# Patient Record
Sex: Female | Born: 1985 | Race: Black or African American | Hispanic: No | Marital: Single | Smoking: Smoker, current status unknown
Health system: Southern US, Community
[De-identification: ages and names within clinical notes are randomized; demographics above are authoritative.]

## PROBLEM LIST (undated history)

## (undated) DIAGNOSIS — F17200 Nicotine dependence, unspecified, uncomplicated: Secondary | ICD-10-CM

## (undated) DIAGNOSIS — J45909 Unspecified asthma, uncomplicated: Secondary | ICD-10-CM

## (undated) DIAGNOSIS — J40 Bronchitis, not specified as acute or chronic: Secondary | ICD-10-CM

---
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
## 2013-04-26 DIAGNOSIS — O1490 Unspecified pre-eclampsia, unspecified trimester: Secondary | ICD-10-CM

## 2019-08-08 DIAGNOSIS — O1490 Unspecified pre-eclampsia, unspecified trimester: Secondary | ICD-10-CM

## 2019-08-08 HISTORY — DX: Unspecified pre-eclampsia, unspecified trimester: O14.90

## 2020-04-26 ENCOUNTER — Inpatient Hospital Stay (HOSPITAL_COMMUNITY): Payer: Medicaid Other | Admitting: Anesthesiology

## 2020-04-26 ENCOUNTER — Inpatient Hospital Stay (HOSPITAL_COMMUNITY)
Admission: AD | Admit: 2020-04-26 | Discharge: 2020-04-29 | DRG: 787 | Disposition: A | Discharge status: Home / Self Care | Payer: Medicaid Other | Attending: Obstetrics and Gynecology | Admitting: Obstetrics and Gynecology

## 2020-04-26 ENCOUNTER — Encounter (HOSPITAL_COMMUNITY): Payer: Self-pay | Admitting: Obstetrics and Gynecology

## 2020-04-26 ENCOUNTER — Other Ambulatory Visit: Payer: Self-pay

## 2020-04-26 ENCOUNTER — Encounter (HOSPITAL_COMMUNITY)
Admission: AD | Disposition: A | Discharge status: Home / Self Care | Payer: Self-pay | Source: Home / Self Care | Attending: Obstetrics and Gynecology

## 2020-04-26 DIAGNOSIS — O34211 Maternal care for low transverse scar from previous cesarean delivery: Secondary | ICD-10-CM | POA: Diagnosis present

## 2020-04-26 DIAGNOSIS — R079 Chest pain, unspecified: Secondary | ICD-10-CM

## 2020-04-26 DIAGNOSIS — O99284 Endocrine, nutritional and metabolic diseases complicating childbirth: Secondary | ICD-10-CM | POA: Diagnosis present

## 2020-04-26 DIAGNOSIS — Z20822 Contact with and (suspected) exposure to covid-19: Secondary | ICD-10-CM | POA: Diagnosis present

## 2020-04-26 DIAGNOSIS — O4593 Premature separation of placenta, unspecified, third trimester: Secondary | ICD-10-CM | POA: Diagnosis present

## 2020-04-26 DIAGNOSIS — Z98891 History of uterine scar from previous surgery: Secondary | ICD-10-CM

## 2020-04-26 DIAGNOSIS — O114 Pre-existing hypertension with pre-eclampsia, complicating childbirth: Secondary | ICD-10-CM | POA: Diagnosis present

## 2020-04-26 DIAGNOSIS — O328XX0 Maternal care for other malpresentation of fetus, not applicable or unspecified: Secondary | ICD-10-CM | POA: Diagnosis present

## 2020-04-26 DIAGNOSIS — O368130 Decreased fetal movements, third trimester, not applicable or unspecified: Secondary | ICD-10-CM | POA: Diagnosis present

## 2020-04-26 DIAGNOSIS — O1002 Pre-existing essential hypertension complicating childbirth: Secondary | ICD-10-CM | POA: Diagnosis present

## 2020-04-26 DIAGNOSIS — Z3A34 34 weeks gestation of pregnancy: Secondary | ICD-10-CM

## 2020-04-26 DIAGNOSIS — O368131 Decreased fetal movements, third trimester, fetus 1: Secondary | ICD-10-CM | POA: Diagnosis not present

## 2020-04-26 DIAGNOSIS — O119 Pre-existing hypertension with pre-eclampsia, unspecified trimester: Secondary | ICD-10-CM | POA: Diagnosis present

## 2020-04-26 DIAGNOSIS — O9081 Anemia of the puerperium: Secondary | ICD-10-CM | POA: Diagnosis not present

## 2020-04-26 DIAGNOSIS — E876 Hypokalemia: Secondary | ICD-10-CM | POA: Diagnosis present

## 2020-04-26 DIAGNOSIS — O4590 Premature separation of placenta, unspecified, unspecified trimester: Secondary | ICD-10-CM | POA: Diagnosis present

## 2020-04-26 DIAGNOSIS — D62 Acute posthemorrhagic anemia: Secondary | ICD-10-CM | POA: Diagnosis not present

## 2020-04-26 DIAGNOSIS — O4693 Antepartum hemorrhage, unspecified, third trimester: Secondary | ICD-10-CM | POA: Diagnosis present

## 2020-04-26 DIAGNOSIS — I10 Essential (primary) hypertension: Secondary | ICD-10-CM | POA: Diagnosis present

## 2020-04-26 DIAGNOSIS — O458X3 Other premature separation of placenta, third trimester: Secondary | ICD-10-CM | POA: Diagnosis not present

## 2020-04-26 HISTORY — DX: Nicotine dependence, unspecified, uncomplicated: F17.200

## 2020-04-26 HISTORY — DX: Unspecified asthma, uncomplicated: J45.909

## 2020-04-26 HISTORY — DX: Bronchitis, not specified as acute or chronic: J40

## 2020-04-26 LAB — CBC
HCT: 34 % — ABNORMAL LOW (ref 36.0–46.0)
HCT: 22.4 % — ABNORMAL LOW (ref 36.0–46.0)
Hemoglobin: 11.4 g/dL — ABNORMAL LOW (ref 12.0–15.0)
Hemoglobin: 7.7 g/dL — ABNORMAL LOW (ref 12.0–15.0)
MCH: 30.2 pg (ref 26.0–34.0)
MCH: 30.7 pg (ref 26.0–34.0)
MCHC: 33.5 g/dL (ref 30.0–36.0)
MCHC: 34.4 g/dL (ref 30.0–36.0)
MCV: 89.9 fL (ref 80.0–100.0)
MCV: 89.2 fL (ref 80.0–100.0)
Platelets: 203 10*3/uL (ref 150–400)
Platelets: 182 10*3/uL (ref 150–400)
RBC: 3.78 MIL/uL — ABNORMAL LOW (ref 3.87–5.11)
RBC: 2.51 MIL/uL — ABNORMAL LOW (ref 3.87–5.11)
RDW: 12.5 % (ref 11.5–15.5)
RDW: 12.6 % (ref 11.5–15.5)
WBC: 8.5 10*3/uL (ref 4.0–10.5)
WBC: 17.9 10*3/uL — ABNORMAL HIGH (ref 4.0–10.5)
nRBC: 0 % (ref 0.0–0.2)
nRBC: 0 % (ref 0.0–0.2)

## 2020-04-26 LAB — COMPREHENSIVE METABOLIC PANEL
ALT: 20 U/L (ref 0–44)
AST: 24 U/L (ref 15–41)
Albumin: 2.5 g/dL — ABNORMAL LOW (ref 3.5–5.0)
Alkaline Phosphatase: 91 U/L (ref 38–126)
Anion gap: 10 (ref 5–15)
BUN: <5 mg/dL — ABNORMAL LOW (ref 6–20)
CO2: 20 mmol/L — ABNORMAL LOW (ref 22–32)
Calcium: 8 mg/dL — ABNORMAL LOW (ref 8.9–10.3)
Chloride: 107 mmol/L (ref 98–111)
Creatinine, Ser: 0.73 mg/dL (ref 0.44–1.00)
GFR calc Af Amer: >60 mL/min (ref >60)
GFR calc non Af Amer: >60 mL/min (ref >60)
Glucose, Bld: 99 mg/dL (ref 70–99)
Potassium: 3 mmol/L — ABNORMAL LOW (ref 3.5–5.1)
Sodium: 137 mmol/L (ref 135–145)
Total Bilirubin: 0.5 mg/dL (ref 0.3–1.2)
Total Protein: 5.6 g/dL — ABNORMAL LOW (ref 6.5–8.1)

## 2020-04-26 LAB — ABO/RH: ABO/RH(D): A POS

## 2020-04-26 LAB — RAPID HIV SCREEN (HIV 1/2 AB+AG)
HIV 1/2 Antibodies: NONREACTIVE
HIV-1 P24 Antigen - HIV24: NONREACTIVE

## 2020-04-26 LAB — PROTEIN / CREATININE RATIO, URINE
Creatinine, Urine: 50.92 mg/dL
Protein Creatinine Ratio: 0.24 mg/mg{Cre} — ABNORMAL HIGH (ref 0.00–0.15)
Total Protein, Urine: 12 mg/dL

## 2020-04-26 LAB — RAPID URINE DRUG SCREEN, HOSP PERFORMED
Amphetamines: NOT DETECTED
Barbiturates: NOT DETECTED
Benzodiazepines: NOT DETECTED
Cocaine: NOT DETECTED
Opiates: NOT DETECTED
Tetrahydrocannabinol: NOT DETECTED

## 2020-04-26 LAB — SARS CORONAVIRUS 2 BY RT PCR (HOSPITAL ORDER, PERFORMED IN ~~LOC~~ HOSPITAL LAB): SARS Coronavirus 2: NEGATIVE

## 2020-04-26 LAB — HEPATITIS B SURFACE ANTIGEN: Hepatitis B Surface Ag: NONREACTIVE

## 2020-04-26 LAB — PREPARE RBC (CROSSMATCH)

## 2020-04-26 SURGERY — Surgical Case
Anesthesia: General

## 2020-04-26 MED ORDER — MORPHINE SULFATE (PF) 0.5 MG/ML IJ SOLN
INTRAMUSCULAR | Status: AC
  Filled 2020-04-26: qty 10

## 2020-04-26 MED ORDER — HYDRALAZINE HCL 20 MG/ML IJ SOLN: 10.0000 mg | INTRAMUSCULAR | Status: DC | PRN

## 2020-04-26 MED ORDER — DIPHENHYDRAMINE HCL 25 MG PO CAPS
25.0000 mg | ORAL_CAPSULE | Freq: Four times a day (QID) | ORAL | Status: DC | PRN
  Administered 2020-04-29: 25 mg via ORAL
  Filled 2020-04-26: qty 1

## 2020-04-26 MED ORDER — FENTANYL CITRATE (PF) 100 MCG/2ML IJ SOLN
INTRAMUSCULAR | Status: AC
  Filled 2020-04-26: qty 2

## 2020-04-26 MED ORDER — WITCH HAZEL-GLYCERIN EX PADS: 1.0000 "application " | MEDICATED_PAD | CUTANEOUS | Status: DC | PRN

## 2020-04-26 MED ORDER — SIMETHICONE 80 MG PO CHEW
80.0000 mg | CHEWABLE_TABLET | ORAL | Status: DC | PRN
  Filled 2020-04-26: qty 1

## 2020-04-26 MED ORDER — FENTANYL CITRATE (PF) 250 MCG/5ML IJ SOLN
INTRAMUSCULAR | Status: AC
  Filled 2020-04-26: qty 5

## 2020-04-26 MED ORDER — IBUPROFEN 600 MG PO TABS
600.0000 mg | ORAL_TABLET | Freq: Four times a day (QID) | ORAL | Status: DC
  Administered 2020-04-26 – 2020-04-29 (×12): 600 mg via ORAL
  Filled 2020-04-26 (×14): qty 1

## 2020-04-26 MED ORDER — LACTATED RINGERS IV SOLN: INTRAVENOUS | Status: DC | PRN

## 2020-04-26 MED ORDER — LABETALOL HCL 5 MG/ML IV SOLN: 80.0000 mg | INTRAVENOUS | Status: DC | PRN

## 2020-04-26 MED ORDER — LACTATED RINGERS IV SOLN: INTRAVENOUS | Status: DC

## 2020-04-26 MED ORDER — ENOXAPARIN SODIUM 40 MG/0.4ML ~~LOC~~ SOLN
40.0000 mg | SUBCUTANEOUS | Status: DC
  Administered 2020-04-27 – 2020-04-29 (×3): 40 mg via SUBCUTANEOUS
  Filled 2020-04-26 (×3): qty 0.4

## 2020-04-26 MED ORDER — DIBUCAINE (PERIANAL) 1 % EX OINT
1.0000 "application " | TOPICAL_OINTMENT | CUTANEOUS | Status: DC | PRN
  Filled 2020-04-26: qty 28

## 2020-04-26 MED ORDER — LABETALOL HCL 5 MG/ML IV SOLN: 20.0000 mg | INTRAVENOUS | Status: DC | PRN

## 2020-04-26 MED ORDER — TRANEXAMIC ACID-NACL 1000-0.7 MG/100ML-% IV SOLN
INTRAVENOUS | Status: DC | PRN
  Administered 2020-04-26: 1000 mg via INTRAVENOUS

## 2020-04-26 MED ORDER — SENNOSIDES-DOCUSATE SODIUM 8.6-50 MG PO TABS
2.0000 | ORAL_TABLET | ORAL | Status: DC
  Administered 2020-04-27 – 2020-04-28 (×3): 2 via ORAL
  Filled 2020-04-26 (×4): qty 2

## 2020-04-26 MED ORDER — LABETALOL HCL 5 MG/ML IV SOLN
INTRAVENOUS | Status: DC | PRN
  Administered 2020-04-26 (×2): 10 mg via INTRAVENOUS

## 2020-04-26 MED ORDER — LABETALOL HCL 5 MG/ML IV SOLN: 40.0000 mg | INTRAVENOUS | Status: DC | PRN

## 2020-04-26 MED ORDER — LACTATED RINGERS IV BOLUS
1000.0000 mL | Freq: Once | INTRAVENOUS | Status: DC
  Administered 2020-04-26: 1000 mL via INTRAVENOUS

## 2020-04-26 MED ORDER — TETANUS-DIPHTH-ACELL PERTUSSIS 5-2.5-18.5 LF-MCG/0.5 IM SUSP
0.5000 mL | Freq: Once | INTRAMUSCULAR | Status: DC
  Filled 2020-04-26: qty 0.5

## 2020-04-26 MED ORDER — PROPOFOL 10 MG/ML IV BOLUS
INTRAVENOUS | Status: AC
  Filled 2020-04-26: qty 20

## 2020-04-26 MED ORDER — MAGNESIUM SULFATE BOLUS VIA INFUSION
4.0000 g | Freq: Once | INTRAVENOUS | Status: AC
  Administered 2020-04-26: 4 g via INTRAVENOUS
  Filled 2020-04-26: qty 1000

## 2020-04-26 MED ORDER — OXYTOCIN-SODIUM CHLORIDE 30-0.9 UT/500ML-% IV SOLN
INTRAVENOUS | Status: DC | PRN
  Administered 2020-04-26: 300 mL via INTRAVENOUS

## 2020-04-26 MED ORDER — PROMETHAZINE HCL 25 MG/ML IJ SOLN: 6.2500 mg | INTRAMUSCULAR | Status: DC | PRN

## 2020-04-26 MED ORDER — SUCCINYLCHOLINE CHLORIDE 20 MG/ML IJ SOLN
INTRAMUSCULAR | Status: DC | PRN
  Administered 2020-04-26: 120 mg via INTRAVENOUS

## 2020-04-26 MED ORDER — ONDANSETRON HCL 4 MG/2ML IJ SOLN
INTRAMUSCULAR | Status: AC
  Filled 2020-04-26: qty 2

## 2020-04-26 MED ORDER — DEXAMETHASONE SODIUM PHOSPHATE 10 MG/ML IJ SOLN
INTRAMUSCULAR | Status: AC
  Filled 2020-04-26: qty 1

## 2020-04-26 MED ORDER — SODIUM CHLORIDE 0.9 % IR SOLN
Status: DC | PRN
  Administered 2020-04-26: 1

## 2020-04-26 MED ORDER — SUCCINYLCHOLINE CHLORIDE 200 MG/10ML IV SOSY
PREFILLED_SYRINGE | INTRAVENOUS | Status: AC
  Filled 2020-04-26: qty 10

## 2020-04-26 MED ORDER — LABETALOL HCL 5 MG/ML IV SOLN
20.0000 mg | INTRAVENOUS | Status: DC | PRN
  Administered 2020-04-26: 20 mg via INTRAVENOUS

## 2020-04-26 MED ORDER — HYDROMORPHONE HCL 1 MG/ML IJ SOLN
1.0000 mg | INTRAMUSCULAR | Status: AC | PRN
  Administered 2020-04-26 (×2): 1 mg via INTRAVENOUS
  Filled 2020-04-26 (×2): qty 1

## 2020-04-26 MED ORDER — SIMETHICONE 80 MG PO CHEW
80.0000 mg | CHEWABLE_TABLET | ORAL | Status: DC
  Administered 2020-04-27: 80 mg via ORAL
  Filled 2020-04-26 (×2): qty 1

## 2020-04-26 MED ORDER — POTASSIUM CHLORIDE CRYS ER 20 MEQ PO TBCR
40.0000 meq | EXTENDED_RELEASE_TABLET | Freq: Two times a day (BID) | ORAL | Status: AC
  Administered 2020-04-26 – 2020-04-27 (×2): 40 meq via ORAL
  Filled 2020-04-26 (×2): qty 2

## 2020-04-26 MED ORDER — CEFAZOLIN SODIUM-DEXTROSE 2-4 GM/100ML-% IV SOLN
INTRAVENOUS | Status: AC
  Filled 2020-04-26: qty 100

## 2020-04-26 MED ORDER — COCONUT OIL OIL
1.0000 "application " | TOPICAL_OIL | Status: DC | PRN
  Filled 2020-04-26: qty 120

## 2020-04-26 MED ORDER — DEXAMETHASONE SODIUM PHOSPHATE 10 MG/ML IJ SOLN
INTRAMUSCULAR | Status: DC | PRN
  Administered 2020-04-26: 10 mg via INTRAVENOUS

## 2020-04-26 MED ORDER — CEFAZOLIN SODIUM-DEXTROSE 2-3 GM-%(50ML) IV SOLR
INTRAVENOUS | Status: DC | PRN
  Administered 2020-04-26: 2 g via INTRAVENOUS

## 2020-04-26 MED ORDER — BUPIVACAINE HCL (PF) 0.5 % IJ SOLN
INTRAMUSCULAR | Status: AC
  Filled 2020-04-26: qty 30

## 2020-04-26 MED ORDER — NIFEDIPINE 10 MG PO CAPS
10.0000 mg | ORAL_CAPSULE | ORAL | Status: DC | PRN
  Filled 2020-04-26: qty 1

## 2020-04-26 MED ORDER — PRENATAL MULTIVITAMIN CH
1.0000 | ORAL_TABLET | Freq: Every day | ORAL | Status: DC
  Administered 2020-04-27 – 2020-04-29 (×2): 1 via ORAL
  Filled 2020-04-26 (×4): qty 1

## 2020-04-26 MED ORDER — ZOLPIDEM TARTRATE 5 MG PO TABS
5.0000 mg | ORAL_TABLET | Freq: Every evening | ORAL | Status: DC | PRN
  Administered 2020-04-28 (×2): 5 mg via ORAL
  Filled 2020-04-26 (×2): qty 1

## 2020-04-26 MED ORDER — OXYTOCIN-SODIUM CHLORIDE 30-0.9 UT/500ML-% IV SOLN
2.5000 [IU]/h | INTRAVENOUS | Status: AC
  Administered 2020-04-26: 2.5 [IU]/h via INTRAVENOUS

## 2020-04-26 MED ORDER — CEFAZOLIN SODIUM-DEXTROSE 2-4 GM/100ML-% IV SOLN: 2.0000 g | INTRAVENOUS | Status: DC

## 2020-04-26 MED ORDER — FENTANYL CITRATE (PF) 100 MCG/2ML IJ SOLN
INTRAMUSCULAR | Status: DC | PRN
Start: 2020-04-26 — End: 2020-04-26
  Administered 2020-04-26: 85 ug via INTRAVENOUS
  Administered 2020-04-26: 250 ug via INTRAVENOUS

## 2020-04-26 MED ORDER — LABETALOL HCL 5 MG/ML IV SOLN
INTRAVENOUS | Status: AC
  Administered 2020-04-26: 40 mg via INTRAVENOUS
  Filled 2020-04-26: qty 8

## 2020-04-26 MED ORDER — BUPIVACAINE HCL (PF) 0.5 % IJ SOLN
INTRAMUSCULAR | Status: DC | PRN
  Administered 2020-04-26: 20 mL

## 2020-04-26 MED ORDER — OXYCODONE HCL 5 MG PO TABS
5.0000 mg | ORAL_TABLET | ORAL | Status: DC | PRN
  Administered 2020-04-26 – 2020-04-27 (×7): 10 mg via ORAL
  Administered 2020-04-28: 5 mg via ORAL
  Administered 2020-04-28 (×4): 10 mg via ORAL
  Administered 2020-04-28: 5 mg via ORAL
  Administered 2020-04-29 (×2): 10 mg via ORAL
  Filled 2020-04-26 (×6): qty 2
  Filled 2020-04-26: qty 1
  Filled 2020-04-26 (×8): qty 2

## 2020-04-26 MED ORDER — PROPOFOL 10 MG/ML IV BOLUS
INTRAVENOUS | Status: DC | PRN
  Administered 2020-04-26: 180 mg via INTRAVENOUS

## 2020-04-26 MED ORDER — ONDANSETRON HCL 4 MG/2ML IJ SOLN
INTRAMUSCULAR | Status: DC | PRN
  Administered 2020-04-26: 4 mg via INTRAVENOUS

## 2020-04-26 MED ORDER — MAGNESIUM SULFATE 40 GM/1000ML IV SOLN
INTRAVENOUS | Status: AC
  Filled 2020-04-26: qty 1000

## 2020-04-26 MED ORDER — SIMETHICONE 80 MG PO CHEW
80.0000 mg | CHEWABLE_TABLET | Freq: Three times a day (TID) | ORAL | Status: DC
  Administered 2020-04-26 – 2020-04-29 (×8): 80 mg via ORAL
  Filled 2020-04-26 (×9): qty 1

## 2020-04-26 MED ORDER — LABETALOL HCL 5 MG/ML IV SOLN
INTRAVENOUS | Status: AC
  Filled 2020-04-26: qty 4

## 2020-04-26 MED ORDER — MENTHOL 3 MG MT LOZG
1.0000 | LOZENGE | OROMUCOSAL | Status: DC | PRN
  Administered 2020-04-28: 3 mg via ORAL
  Filled 2020-04-26 (×2): qty 9

## 2020-04-26 MED ORDER — FENTANYL CITRATE (PF) 100 MCG/2ML IJ SOLN
25.0000 ug | INTRAMUSCULAR | Status: DC | PRN
  Administered 2020-04-26: 50 ug via INTRAVENOUS
  Administered 2020-04-26 (×2): 25 ug via INTRAVENOUS

## 2020-04-26 MED ORDER — STERILE WATER FOR IRRIGATION IR SOLN
Status: DC | PRN
  Administered 2020-04-26: 1000 mL

## 2020-04-26 MED ORDER — PHENYLEPHRINE HCL-NACL 20-0.9 MG/250ML-% IV SOLN
INTRAVENOUS | Status: AC
  Filled 2020-04-26: qty 250

## 2020-04-26 MED ORDER — MAGNESIUM SULFATE 40 GM/1000ML IV SOLN
2.0000 g/h | INTRAVENOUS | Status: AC
  Administered 2020-04-26: 2 g/h via INTRAVENOUS
  Filled 2020-04-26: qty 1000

## 2020-04-26 SURGICAL SUPPLY — 37 items
BENZOIN TINCTURE PRP APPL 2/3 (GAUZE/BANDAGES/DRESSINGS) IMPLANT
CHLORAPREP W/TINT 26ML (MISCELLANEOUS) ×3 IMPLANT
CLAMP CORD UMBIL (MISCELLANEOUS) IMPLANT
CLOSURE WOUND 1/2 X4 (GAUZE/BANDAGES/DRESSINGS)
CLOTH BEACON ORANGE TIMEOUT ST (SAFETY) ×3 IMPLANT
DRSG OPSITE POSTOP 4X10 (GAUZE/BANDAGES/DRESSINGS) ×3 IMPLANT
ELECT REM PT RETURN 9FT ADLT (ELECTROSURGICAL) ×3
ELECTRODE REM PT RTRN 9FT ADLT (ELECTROSURGICAL) ×1 IMPLANT
EXTRACTOR VACUUM BELL STYLE (SUCTIONS) IMPLANT
GLOVE BIOGEL PI IND STRL 6.5 (GLOVE) ×1 IMPLANT
GLOVE BIOGEL PI IND STRL 7.0 (GLOVE) ×2 IMPLANT
GLOVE BIOGEL PI INDICATOR 6.5 (GLOVE) ×2
GLOVE BIOGEL PI INDICATOR 7.0 (GLOVE) ×4
GLOVE ORTHOPEDIC STR SZ6.5 (GLOVE) ×3 IMPLANT
GOWN STRL REUS W/TWL LRG LVL3 (GOWN DISPOSABLE) ×9 IMPLANT
HEMOSTAT ARISTA ABSORB 3G PWDR (HEMOSTASIS) ×3 IMPLANT
KIT ABG SYR 3ML LUER SLIP (SYRINGE) IMPLANT
NEEDLE HYPO 22GX1.5 SAFETY (NEEDLE) ×3 IMPLANT
NEEDLE HYPO 25X1 1.5 SAFETY (NEEDLE) IMPLANT
NS IRRIG 1000ML POUR BTL (IV SOLUTION) ×3 IMPLANT
PACK C SECTION WH (CUSTOM PROCEDURE TRAY) ×3 IMPLANT
PAD OB MATERNITY 4.3X12.25 (PERSONAL CARE ITEMS) ×3 IMPLANT
PENCIL SMOKE EVAC W/HOLSTER (ELECTROSURGICAL) ×3 IMPLANT
SPONGE LAP 18X18 RF (DISPOSABLE) ×3 IMPLANT
STRIP CLOSURE SKIN 1/2X4 (GAUZE/BANDAGES/DRESSINGS) IMPLANT
SUT MON AB 4-0 PS1 27 (SUTURE) ×3 IMPLANT
SUT PLAIN 2 0 (SUTURE) ×2
SUT PLAIN ABS 2-0 CT1 27XMFL (SUTURE) ×1 IMPLANT
SUT VIC AB 0 CT1 36 (SUTURE) ×6 IMPLANT
SUT VIC AB 0 CTX 36 (SUTURE) ×4
SUT VIC AB 0 CTX36XBRD ANBCTRL (SUTURE) ×2 IMPLANT
SUT VIC AB 2-0 CT1 27 (SUTURE) ×2
SUT VIC AB 2-0 CT1 TAPERPNT 27 (SUTURE) ×1 IMPLANT
SYR CONTROL 10ML LL (SYRINGE) ×3 IMPLANT
TOWEL OR 17X24 6PK STRL BLUE (TOWEL DISPOSABLE) ×3 IMPLANT
TRAY FOLEY W/BAG SLVR 14FR LF (SET/KITS/TRAYS/PACK) ×3 IMPLANT
WATER STERILE IRR 1000ML POUR (IV SOLUTION) ×3 IMPLANT

## 2020-04-26 NOTE — Lactation Note (Addendum)
This note was copied from a Leah's chart. Lactation Consultation Note  Patient Name: Leah Underwood [id number] Date: 04/26/2020  Mom is a P2. First Leah was full term.  Leah Underwood now 8 hours old born at 20 weeks in the NICU. Mom reports she breastfed her first Leah for 3 months. Mom reports she would like to breastfeed this Leah longer.  Mom is from [place], [place] and was on [facility] there.  Mom reports that once she got put on bed rest she came down to be with her mom and her mom take care of her but went into labor shortly after,  Mom reports she had really sore nipples with her first Leah so mostly pumped.  Mom reports she does not have a DEBP for home use. Mom has not initiated pumping with DEBP.  Initiated pumping with mom with DEBP.  Mom reports she doesn't really like the DEBP.  Explained to mom it was really good for stimulation and that her hands were bettter for removal.  Mom reports she likes the manual breastpump better. Urged her to use the DEBP as much as she could for stimulation even if she didn't like it.  Mom able to remove drops of colostrum with her hands easily. Urged mom to massage and hand express and [place] the DEBP 8-12 times day.  Did not give NICU booklet at this time.  Urged mom to call lactation as needed.   Maternal Data    Feeding    LATCH Score                   Interventions    Lactation Tools Discussed/Used     Consult Status      Leah Underwood 04/26/2020, 10:50 PM

## 2020-04-26 NOTE — Anesthesia Postprocedure Evaluation (Signed)
Anesthesia Post Note  Patient: Leah Underwood  Procedure(s) Performed: CESAREAN SECTION (N/A )     Patient location during evaluation: PACU Anesthesia Type: General Level of consciousness: awake and alert and awake Pain management: pain level controlled Vital Signs Assessment: post-procedure vital signs reviewed and stable Respiratory status: spontaneous breathing, nonlabored ventilation, respiratory function stable and patient connected to nasal cannula oxygen Cardiovascular status: blood pressure returned to baseline and stable Postop Assessment: no apparent nausea or vomiting Anesthetic complications: no   No complications documented.  Last Vitals:  Vitals:   04/26/20 1320 04/26/20 1326  BP: (!) 131/113 (!) 141/93  Pulse: 75 73  Resp:  16  Temp: 36.4 C   SpO2: 100% 100%    Last Pain:  Vitals:   04/26/20 1320  TempSrc: Oral  PainSc:    Pain Goal:                   Cecile Hearing

## 2020-04-26 NOTE — Anesthesia Procedure Notes (Signed)
Procedure Name: Intubation Date/Time: 04/26/2020 11:18 AM Performed by: Orlie Pollen, CRNA Pre-anesthesia Checklist: Patient identified, Patient being monitored, Timeout performed, Emergency Drugs available and Suction available Patient Re-evaluated:Patient Re-evaluated prior to induction Oxygen Delivery Method: Circle System Utilized Preoxygenation: Pre-oxygenation with 100% oxygen Induction Type: IV induction, Rapid sequence and Cricoid Pressure applied Laryngoscope Size: 3 and Glidescope (Low Pro 3) Grade View: Grade I Tube type: Oral Tube size: 7.0 mm Number of attempts: 1 Airway Equipment and Method: stylet,  Video-laryngoscopy and Oral airway Placement Confirmation: ETT inserted through vocal cords under direct vision,  positive ETCO2 and breath sounds checked- equal and bilateral Secured at: 21 cm Tube secured with: Tape Dental Injury: Teeth and Oropharynx as per pre-operative assessment

## 2020-04-26 NOTE — Progress Notes (Signed)
Down to see patient for left sided pain. She is doing well, reports some pain. Improved with diluadid. Scheduled motrin ordered, oxycodone prn.  Reviewed days events, course, need for magnesium, +/- anti-hypertensives. Answered all questions. She and her mother verbalize understanding and are grateful for care received.  Cont mag Pain meds scheduled/prn    Baldemar Lenis, M.D. Attending Center for [employer] [facility])

## 2020-04-26 NOTE — Transfer of Care (Signed)
Immediate Anesthesia Transfer of Care Note  Patient: Leah Underwood  Procedure(s) Performed: CESAREAN SECTION (N/A )  Patient Location: PACU  Anesthesia Type:General  Level of Consciousness: awake, alert , oriented and patient cooperative  Airway & Oxygen Therapy: Patient Spontanous Breathing and Patient connected to nasal cannula oxygen  Post-op Assessment: Report given to RN, Post -op Vital signs reviewed and stable and Patient moving all extremities X 4  Post vital signs: Reviewed and stable  Last Vitals:  Vitals Value Taken Time  BP 153/102 04/26/20 1145  Temp    Pulse 73 04/26/20 1146  Resp 14 04/26/20 1146  SpO2 100 % 04/26/20 1146  Vitals shown include unvalidated device data.  Last Pain: There were no vitals filed for this visit.       Complications: No complications documented.

## 2020-04-26 NOTE — Progress Notes (Signed)
None    S [name] is a 34 y.o. G51P1101 pregnant female who presents to MAU via EMS with chief complaint of vaginal bleeding, . Patient receives care in [place], [place]. Endorses orgasms x 3 last night, remote from sexual intercourse. Hx cesarean, planning repeat. Patient states she has been NPO since last night  O BP (!) 158/114   Pulse 92   Breastfeeding Unknown     Physical Exam Vitals and nursing note reviewed. Exam conducted with a chaperone present.  Constitutional:      General: She is in acute distress.  Cardiovascular:     Rate and Rhythm: Normal rate.  Pulmonary:     Effort: Pulmonary effort is normal.  Abdominal:     Palpations: Abdomen is rigid.     Tenderness: There is no abdominal tenderness.     Comments: Gravid  Genitourinary:    Vagina: Bleeding present.  Skin:    Capillary Refill: Capillary refill takes less than 2 seconds.  Neurological:     General: No focal deficit present.     Mental Status: She is alert.  Psychiatric:        Mood and Affect: Mood normal.    A --Patient met by CNM on arrival due to concern for bleeding, preterm labor.  --Vocalizing on arrival to unit  --Small amount of dark red bleeding visible on labia --BSUS utilized to confirm fetal heart tones, FHT 145 --EFM and toco applied by RNs --Cervix 1/thick/posterior via sterile vaginal exam --Hypertensive, otherwise stable on initial exam --Verbal orders placed to prep patient for OR --Dr. Rosana Hoes called by MAU Charge RN --Report given to E. Lawrence, NP to assist with ongoing surveillance of bleeding --Dr. Rosana Hoes on unit a short time later  P --Prep for OR, concern for abruption  Mallie Snooks, CNM 04/26/2020 11:10 AM

## 2020-04-26 NOTE — MAU Provider Note (Addendum)
Thalia Bloodgood CNM in room with pt, VE and  U/S performed to confirm FHTs.  and states she will come back and re-evaluate

## 2020-04-26 NOTE — MAU Note (Signed)
PT profusely sweating and unable to lay still stating that she is having \\trouble  breathing.

## 2020-04-26 NOTE — Op Note (Signed)
[name] PROCEDURE DATE: 04/26/2020  PREOPERATIVE DIAGNOSES: Intrauterine pregnancy at [redacted]w[redacted]d weeks gestation; abruptio placenta and prior uterine incision, worsening chronic hypertension versus superimposed pre-eclampsia  POSTOPERATIVE DIAGNOSES: The same  PROCEDURE: Repeat Low Transverse Cesarean Section  SURGEON:  Baldemar Lenis, MD  ASSISTANT:  Mart Piggs, MD  An experienced assistant was required given the standard of surgical care given the complexity of the case.  This assistant was needed for exposure, dissection, suctioning, retraction, instrument exchange, assisting with delivery with administration of fundal pressure, and for overall help during the procedure.  ANESTHESIOLOGY TEAM: Anesthesiologist: Cecile Hearing, MD CRNA: Graciela Husbands, CRNA; Orlie Pollen, CRNA  INDICATIONS: Leah Underwood is a 34 y.o. G2P1101 at [redacted]w[redacted]d here for cesarean section secondary to the indications listed under preoperative diagnoses; please see preoperative note for further details.  The risks of cesarean section were discussed with the patient including but were not limited to: bleeding which may require transfusion or reoperation; infection which may require antibiotics; injury to bowel, bladder, ureters or other surrounding organs; injury to the fetus; need for additional procedures including hysterectomy in the event of a life-threatening hemorrhage; placental abnormalities wth subsequent pregnancies, incisional problems, thromboembolic phenomenon and other postoperative/anesthesia complications.  She consents to blood transfusion in the event of an emergency. The patient verbalized understanding of the plan, giving informed written consent for the procedure.    FINDINGS:  Viable female infant in transverse presentation, delivered breech, no tone or respiratory effort on delivery. Large amount of bright red clot expressed on making the hysterotomy, anterior placenta. Apgars 7 and 8.  Weight: 2200 gm. Clear amniotic fluid.  Intact placenta, three vessel cord.  Normal uterus, fallopian tubes and ovaries bilaterally.  ANESTHESIA: General anesthesia INTRAVENOUS FLUIDS: 2400 ml   ESTIMATED BLOOD LOSS: 1467 ml URINE OUTPUT:  100 ml SPECIMENS: Placenta sent to pathology COMPLICATIONS: None immediate  PROCEDURE IN DETAIL:  The patient was taken urgently to OR and given intravenous antibiotics and had sequential compression devices applied to her lower extremities. She was then placed in a dorsal supine position with a leftward tilt. She was prepped and draped in a sterile manner.  A foley catheter was placed into her bladder with sterile technique and attached to constant gravity. General anesthesia was administered and and was found to be adequate.  After a timeout was performed, a Pfannenstiel skin incision was made with scalpel over her preexisting scar and carried through to the underlying layer of fascia. The fascia was incised in the midline, and this incision was extended bilaterally using the Mayo scissors.  Kocher clamps were applied to the superior aspect of the fascial incision and the underlying rectus muscles were dissected off sharply.  A similar process was carried out on the inferior aspect of the fascial incision. The rectus muscles were separated in the midline bluntly and the peritoneum was entered bluntly. The peritoneal incision was carefully extended bluntly laterally and caudad with good visualization of the bladder. The uterus appeared normal. The Alexis O-ring retractor was placed into the incision, taking care not to incorporate bowel or omentum. The bladder blade was inserted. Attention was turned to the lower uterine segment where a low transverse hysterotomy was made with a scalpel and extended bilaterally bluntly.  The infant was delivered from double footling breech position, no tone noted and the cord was clamped and cut and infant immediately handed over to  waiting neonatology team. Uterine massage was then performed, and the placenta delivered intact with  a three-vessel cord. The uterus was then cleared of clots and debris.  The hysterotomy was closed with 0 Vicryl in a running locked fashion, and an imbricating layer was also placed with 0 Vicryl. Figure-of-eight 0 Vicryl serosal stitches were placed to help with hemostasis.  The fallopian tubes and ovaries were visualized bilaterally and normal appearing. The Alexis retractor was removed.  The pelvis was cleared of all clot and debris. Arista was placed over the hysterotomy for additional hemostasis.  Hemostasis was again confirmed on all surfaces. The fascia was then closed using 0 Vicryl in a running fashion.  The subcutaneous layer was irrigated, then reapproximated with 2-0 plain gut, and 20 ml of 0.5% Marcaine was injected subcutaneously around the incision.  The skin was closed with a 4-0 Monocryl subcuticular stitch. The patient was extubated successfully and tolerated the procedure well. Sponge, lap, instrument and needle counts were correct x 3.  She was taken to the recovery room in stable condition.    Baldemar Lenis, M.D. Attending Obstetrician & Gynecologist, [facility] for [employer], [facility]

## 2020-04-26 NOTE — Anesthesia Preprocedure Evaluation (Signed)
Anesthesia Evaluation  Patient identified by MRN, date of birth, ID band Patient awake    Reviewed: Allergy & Precautions, NPO status , Patient's Chart, lab work & pertinent test results  Airway Mallampati: II  TM Distance: >3 FB Neck ROM: Full    Dental  (+) Teeth Intact   Pulmonary asthma , Current Smoker,    Pulmonary exam normal breath sounds clear to auscultation       Cardiovascular hypertension, Normal cardiovascular exam Rhythm:Regular Rate:Normal     Neuro/Psych negative neurological ROS     GI/Hepatic negative GI ROS, Neg liver ROS,   Endo/Other  negative endocrine ROS  Renal/GU negative Renal ROS     Musculoskeletal negative musculoskeletal ROS (+)   Abdominal   Peds  Hematology negative hematology ROS (+)   Anesthesia Other Findings Day of surgery medications reviewed with the patient.  Reproductive/Obstetrics (+) Pregnancy                             Anesthesia Physical Anesthesia Plan  ASA: III and emergent  Anesthesia Plan: General   Post-op Pain Management:    Induction: Intravenous  PONV Risk Score and Plan: 3 and Dexamethasone and Ondansetron  Airway Management Planned: Oral ETT and Video Laryngoscope Planned  Additional Equipment:   Intra-op Plan:   Post-operative Plan: Extubation in OR  Informed Consent: I have reviewed the patients History and Physical, chart, labs and discussed the procedure including the risks, benefits and alternatives for the proposed anesthesia with the patient or authorized representative who has indicated his/her understanding and acceptance.       Plan Discussed with: CRNA  Anesthesia Plan Comments:         Anesthesia Quick Evaluation

## 2020-04-26 NOTE — MAU Note (Signed)
Pt presents to MAU via EMS for heavy vaginal bleeding and abdominal pain that started this morning.

## 2020-04-26 NOTE — MAU Note (Signed)
Heavy vaginal bleeding noted abdomen firm, S. Weinhold CNM notified . States Dr Earlene Plater in on her way to evaluate her.Marland Kitchen

## 2020-04-26 NOTE — MAU Note (Signed)
Dr Earlene Plater at bedside discussing plan of care.

## 2020-04-26 NOTE — Consult Note (Signed)
Neonatology Note:   Attendance at C-section:    I was asked by Dr. Earlene Plater to attend this urgent C/S at 34.[redacted] weeks EGA for vaginal bleeding concerning for placental abruption. The mother is a G2P1, GBS unk with reported prenatal care.  Covid rapid test pending.  I introduced myself in MAU and talked very briefly en route to OR. ROM 0h 19m prior to delivery, fluid bloody. Infant not vigorous nor with good spontaneous cry and tone. Cord cut and baby immediately brought to ISR warmer.  Dried and stimulated.  HR not palpable.  CODE Neonate called.  PPV initiated and Sao2 placed.  Good fairly quick response in HR to >100.  Some cough spasms then gradual improvements in resp effort with spontaneous and consistent effort by ~2-66min of life.  Transitioned to cpap and fio2 ~70% to maintain appropriate Sao2.  Pulses palpable, color pink, acroscyanosis, mild hypotonia, improving lung sounds bilat with mild coarseness. Ap 4/7/8.  Transferred to NICU stable and without adverse events.     Dineen Kid Leary Roca, MD Neonatologist 04/26/2020, 11:32 AM

## 2020-04-26 NOTE — H&P (Signed)
Obstetric History and Physical  Calla Wedekind is a 34 y.o. G2P1000 with IUP at [redacted]w[redacted]d presenting for large amount of vaginal bleeding. Started this am, reports contractions. Decreased fetal movement. cHTN on no meds, care outside of system. Reports h/o 1 CS with no other medical issues. 34 weeks.  Medical History:  No past medical history on file.    OB History  Gravida Para Term Preterm AB Living  2 1 1         SAB TAB Ectopic Multiple Live Births               # Outcome Date GA Lbr Len/2nd Weight Sex Delivery Anes PTL Lv  2 Current           1 Term 04/26/13     CS-LTranv        Complications: Preeclampsia    Social History   Socioeconomic History  . Marital status: Single    Spouse name: Not on file  . Number of children: Not on file  . Years of education: Not on file  . Highest education level: Not on file  Occupational History  . Not on file  Tobacco Use  . Smoking status: Not on file  Substance and Sexual Activity  . Alcohol use: Not on file  . Drug use: Not on file  . Sexual activity: Not on file  Other Topics Concern  . Not on file  Social History Narrative  . Not on file   Social Determinants of Health   Financial Resource Strain:   . Difficulty of Paying Living Expenses: Not on file  Food Insecurity:   . Worried About 04/28/13 in the Last Year: Not on file  . Ran Out of Food in the Last Year: Not on file  Transportation Needs:   . Lack of Transportation (Medical): Not on file  . Lack of Transportation (Non-Medical): Not on file  Physical Activity:   . Days of Exercise per Week: Not on file  . Minutes of Exercise per Session: Not on file  Stress:   . Feeling of Stress : Not on file  Social Connections:   . Frequency of Communication with Friends and Family: Not on file  . Frequency of Social Gatherings with Friends and Family: Not on file  . Attends Religious Services: Not on file  . Active Member of Clubs or Organizations: Not on file   . Attends Programme researcher, broadcasting/film/video Meetings: Not on file  . Marital Status: Not on file    No family history on file.  No medications prior to admission.    Not on File  Review of Systems: Negative except for what is mentioned in HPI.  Physical Exam: BP (!) 158/114   Pulse 92  CONSTITUTIONAL: Well-developed, well-nourished female in significant pain.  HENT:  Normocephalic, atraumatic, External right and left ear normal. Oropharynx is clear and moist EYES: Conjunctivae and EOM are normal. Pupils are equal, round, and reactive to light. No scleral icterus.  NECK: Normal range of motion, supple, no masses SKIN: Skin is warm and dry. No rash noted. Not diaphoretic. No erythema. No pallor. NEUROLOGIC: Alert and oriented to person, place, and time. Normal reflexes, muscle tone coordination. No cranial nerve deficit noted. PSYCHIATRIC: Normal mood and affect. Normal behavior. Normal judgment and thought content. CARDIOVASCULAR: Normal heart rate noted RESPIRATORY: Effort normal, no problems with respiration noted ABDOMEN: Soft, hard to touch, gravid. MUSCULOSKELETAL: Normal range of motion. No edema and no tenderness. 2+  distal pulses.  Cervical Exam: Dilatation 1cm   Effacement thick FHT:  Baseline rate 150 bpm   Variability moderate  Accelerations absent   Decelerations none Contractions: Every 2 mins   Pertinent Labs/Studies:   No results found for this or any previous visit (from the past 24 hour(s)).  Assessment : Nasiya Pascual is a 34 y.o. G2P1000 at [redacted]w[redacted]d being admitted for repeat -csection for suspected abruption. H/o 1 x CS. cHTN on no meds    Plan:  To OR urgently for suspected abruption  The risks of cesarean section were discussed with the patient; including but not limited to: infection which may require antibiotics; bleeding which may require transfusion or re-operation; injury to bowel, bladder, ureters or other surrounding organs; need for additional procedures in  the event of life threatening hemorrhage; placental abnormalities wth subsequent pregnancies,  risk of needing c-sections in future pregnancies, incisional problems, thromboembolic phenomenon and other postoperative/anesthesia complications. Answered all questions. The patient verbalized understanding of the plan, giving informed consent for the procedure. She is agreeable to blood transfusion in the event of emergency.  Patient has been NPO since last night, she will remain NPO for procedure Anesthesia and OR aware Preoperative prophylactic antibiotics and SCDs ordered on call to the OR  To OR when ready    K. Therese Sarah, M.D. Attending Center for [employer] (Faculty Practice)  04/26/2020, 10:33 AM

## 2020-04-26 NOTE — MAU Note (Signed)
House coverage and OB OR charge nurse notified of urgent c-section.

## 2020-04-27 ENCOUNTER — Encounter (HOSPITAL_COMMUNITY): Payer: Self-pay | Admitting: Obstetrics and Gynecology

## 2020-04-27 ENCOUNTER — Inpatient Hospital Stay (HOSPITAL_COMMUNITY): Payer: Medicaid Other

## 2020-04-27 DIAGNOSIS — E876 Hypokalemia: Secondary | ICD-10-CM | POA: Diagnosis present

## 2020-04-27 DIAGNOSIS — I10 Essential (primary) hypertension: Secondary | ICD-10-CM | POA: Diagnosis present

## 2020-04-27 DIAGNOSIS — D62 Acute posthemorrhagic anemia: Secondary | ICD-10-CM | POA: Diagnosis present

## 2020-04-27 DIAGNOSIS — Z98891 History of uterine scar from previous surgery: Secondary | ICD-10-CM

## 2020-04-27 DIAGNOSIS — O119 Pre-existing hypertension with pre-eclampsia, unspecified trimester: Secondary | ICD-10-CM | POA: Diagnosis present

## 2020-04-27 LAB — PREPARE RBC (CROSSMATCH)

## 2020-04-27 LAB — CBC
HCT: 13.9 % — ABNORMAL LOW (ref 36.0–46.0)
HCT: 20 % — ABNORMAL LOW (ref 36.0–46.0)
HCT: 22.1 % — ABNORMAL LOW (ref 36.0–46.0)
Hemoglobin: 4.8 g/dL — CL (ref 12.0–15.0)
Hemoglobin: 6.9 g/dL — CL (ref 12.0–15.0)
Hemoglobin: 7.6 g/dL — ABNORMAL LOW (ref 12.0–15.0)
MCH: 31.2 pg (ref 26.0–34.0)
MCH: 31.2 pg (ref 26.0–34.0)
MCH: 31 pg (ref 26.0–34.0)
MCHC: 34.5 g/dL (ref 30.0–36.0)
MCHC: 34.5 g/dL (ref 30.0–36.0)
MCHC: 34.4 g/dL (ref 30.0–36.0)
MCV: 90.3 fL (ref 80.0–100.0)
MCV: 90.5 fL (ref 80.0–100.0)
MCV: 90.2 fL (ref 80.0–100.0)
Platelets: 171 10*3/uL (ref 150–400)
Platelets: 132 10*3/uL — ABNORMAL LOW (ref 150–400)
Platelets: 124 10*3/uL — ABNORMAL LOW (ref 150–400)
RBC: 1.54 MIL/uL — ABNORMAL LOW (ref 3.87–5.11)
RBC: 2.21 MIL/uL — ABNORMAL LOW (ref 3.87–5.11)
RBC: 2.45 MIL/uL — ABNORMAL LOW (ref 3.87–5.11)
RDW: 12.8 % (ref 11.5–15.5)
RDW: 13.2 % (ref 11.5–15.5)
RDW: 13.8 % (ref 11.5–15.5)
WBC: 17.4 10*3/uL — ABNORMAL HIGH (ref 4.0–10.5)
WBC: 13.7 10*3/uL — ABNORMAL HIGH (ref 4.0–10.5)
WBC: 12.4 10*3/uL — ABNORMAL HIGH (ref 4.0–10.5)
nRBC: 0 % (ref 0.0–0.2)
nRBC: 0 % (ref 0.0–0.2)
nRBC: 0 % (ref 0.0–0.2)

## 2020-04-27 LAB — COMPREHENSIVE METABOLIC PANEL
ALT: 23 U/L (ref 0–44)
AST: 60 U/L — ABNORMAL HIGH (ref 15–41)
Albumin: 2 g/dL — ABNORMAL LOW (ref 3.5–5.0)
Alkaline Phosphatase: 58 U/L (ref 38–126)
Anion gap: 8 (ref 5–15)
BUN: <5 mg/dL — ABNORMAL LOW (ref 6–20)
CO2: 20 mmol/L — ABNORMAL LOW (ref 22–32)
Calcium: 6.6 mg/dL — ABNORMAL LOW (ref 8.9–10.3)
Chloride: 104 mmol/L (ref 98–111)
Creatinine, Ser: 0.79 mg/dL (ref 0.44–1.00)
GFR calc Af Amer: >60 mL/min (ref >60)
GFR calc non Af Amer: >60 mL/min (ref >60)
Glucose, Bld: 118 mg/dL — ABNORMAL HIGH (ref 70–99)
Potassium: 4.1 mmol/L (ref 3.5–5.1)
Sodium: 132 mmol/L — ABNORMAL LOW (ref 135–145)
Total Bilirubin: 0.3 mg/dL (ref 0.3–1.2)
Total Protein: 4.2 g/dL — ABNORMAL LOW (ref 6.5–8.1)

## 2020-04-27 LAB — RPR: RPR Ser Ql: NONREACTIVE

## 2020-04-27 LAB — MAGNESIUM: Magnesium: 5.4 mg/dL — ABNORMAL HIGH (ref 1.7–2.4)

## 2020-04-27 MED ORDER — SODIUM CHLORIDE 0.9% IV SOLUTION: Freq: Once | INTRAVENOUS | Status: DC

## 2020-04-27 MED ORDER — NIFEDIPINE ER OSMOTIC RELEASE 30 MG PO TB24
30.0000 mg | ORAL_TABLET | Freq: Every day | ORAL | Status: DC
  Administered 2020-04-27 – 2020-04-28 (×2): 30 mg via ORAL
  Filled 2020-04-27 (×2): qty 1

## 2020-04-27 MED ORDER — ACETAMINOPHEN 325 MG PO TABS
650.0000 mg | ORAL_TABLET | Freq: Four times a day (QID) | ORAL | Status: DC | PRN
  Administered 2020-04-27: 650 mg via ORAL
  Filled 2020-04-27: qty 2

## 2020-04-27 MED ORDER — MEDROXYPROGESTERONE ACETATE 150 MG/ML IM SUSP
150.0000 mg | INTRAMUSCULAR | Status: DC
  Administered 2020-04-28: 150 mg via INTRAMUSCULAR
  Filled 2020-04-27: qty 1

## 2020-04-27 MED ORDER — CALCIUM CARBONATE ANTACID 500 MG PO CHEW
400.0000 mg | CHEWABLE_TABLET | Freq: Two times a day (BID) | ORAL | Status: AC
  Administered 2020-04-27 – 2020-04-28 (×4): 400 mg via ORAL
  Filled 2020-04-27 (×4): qty 2

## 2020-04-27 MED ORDER — FAMOTIDINE 20 MG PO TABS
20.0000 mg | ORAL_TABLET | Freq: Every day | ORAL | Status: DC
  Administered 2020-04-27 – 2020-04-29 (×3): 20 mg via ORAL
  Filled 2020-04-27 (×3): qty 1

## 2020-04-27 NOTE — Progress Notes (Signed)
Attempted to alert on-call MD with critical lab value: Hgb 4.8.  Pt asymptomatic at this time. Will try calling MD and leaving message again.

## 2020-04-27 NOTE — Progress Notes (Signed)
Faculty Attending Note  Post Op Day 1  Subjective: Patient is feeling some chest pain. She reports moderately well controlled pain on PO pain meds, requesting IV meds. She is not ambulating, denies light-headedness or dizziness. She has foley catheter in place. She is not passing flatus, has not had a BM yet. She is tolerating a regular diet without nausea/vomiting. Bleeding is moderate. She is breast feeding. Baby is in NICU and doing well.  Pt was noted to have a fall last pm, per patient was on toilet and fell forward but caught herself with hands. She has no complaints about that today, no facial pain, hand pain.   Objective: Blood pressure (!) 138/91, pulse (!) 107, temperature 98.3 F (36.8 C), temperature source Oral, resp. rate 18, weight 75.3 kg, SpO2 100 %, unknown if currently breastfeeding. Temp:  [93.6 F (34.2 C)-98.3 F (36.8 C)] 98.3 F (36.8 C) (09/21 0804) Pulse Rate:  [59-144] 107 (09/21 0804) Resp:  [8-20] 18 (09/21 0804) BP: (110-163)/(63-128) 138/91 (09/21 0804) SpO2:  [99 %-100 %] 100 % (09/21 0804) Weight:  [75.3 kg] 75.3 kg (09/20 1626)  Physical Exam:  General: alert, oriented, cooperative Chest: normal respiratory effort Heart: RRR  Abdomen: soft, mildly tender to palpation in epigastric area, appropriately tender to palpation, incision covered by dressing with no evidence of active bleeding  Uterine Fundus: firm, at the umbilicus Lochia: moderate, rubra DVT Evaluation: no evidence of DVT Extremities: no edema, no calf tenderness  UOP: >200 mL/hr clear yellow urine  Recent Labs    04/26/20 1429 04/27/20 0449  HGB 7.7* 4.8*  HCT 22.4* 13.9*    Assessment/Plan: Patient Active Problem List   Diagnosis Date Noted  . Acute blood loss anemia 04/27/2020  . Chronic hypertension 04/27/2020  . Chronic hypertension with superimposed pre-eclampsia 04/27/2020  . H/O: C-section 04/27/2020  . Antepartum placental abruption 04/26/2020    Patient is 34  y.o. [id number] POD#1 s/p RCS at [redacted]w[redacted]d for placental abruption, chronic hypertension with superimposed pre-eclampsia. Course complicated by significant acute blood loss anemia with H/H of 4.8/13.9 this am, currently getting 2 units blood. Will likely transfuse another 1-2 units after. On mag until 11 am today. Hypokalemia resolved, new hypocalcemia.  She is doing okay, recovering appropriately but complains of chest pain and shortness of breath. Suspect this is related to acute blood loss anemia and will improve with blood transfusion but will get CXR and EKG to rule out cardiac issues. Start pepcid as well.   Continue routine post partum care Pain meds prn Tums for calcium Cont potassium repletion Regular diet Lovenox 40 mg daily [place] depo for birth control Plan for discharge possibly tomorrow   K. Therese Sarah, M.D. Attending Center for [employer] (Faculty Practice)  04/27/2020, 8:50 AM

## 2020-04-27 NOTE — Progress Notes (Addendum)
Nurse alerted by nurse tech to come to patient's bathroom STAT, because patient had fallen while using the bathroom.   Upon nurses entry into bathroom found patient lying on floor stomach down. The IV pole was still connected to patient and lying beside her. Nurse immediately assessed patients LOC, patient was alert but unaware of how she fell.   Nurse instructed nurse tech to obtain dinamap and steady. While waiting for these items, unit secretary assisted nurse to ambulate patient over to the toilet to sit. While on the toilet patient became dizzy and dazed. Nurse instructed patient to state her name and patient came to. She was sweating at this time. A fan and cold cloth were provided and patient returned to baseline.   Vital signs were obtained and WNL. Patient was then transported back to bed by unit secretary and charge nurse with no further events. Vital signs obtained again once patient settled in bed, and were WNL.   Nurse reassessed patient and found no significant findings. Patient stated "I'm gucci now" and provided a smile and laugh. Patient A&O x4. Provider alerted of event at 2206. No new orders at that time. Nurse will continue to monitor patient closely throughout night.   Press photographer, Licensed conveyancer, Psychologist, sport and exercise, and patient nurse present during event.

## 2020-04-27 NOTE — Lactation Note (Signed)
This note was copied from a baby's chart. Lactation Consultation Note  Patient Name: Leah Laura-Lee Underwood [id number] Date: 04/27/2020 Reason for consult: Follow-up assessment;NICU baby;Infant < 6lbs;Late-preterm 34-36.6wks Maternal hgb 4.8   LC visited with P2 Mom of baby in the NICU (34 wks, <5 lbs)  Mom refusing to use DEBP currently (on MgSO4), but is using the hand pump and expressing milk for baby in the NICU.  Mom has breast milk labels and containers.  Pump at bedside.  Demonstrated how to disassemble and wash, rinse and air dry parts after every use.    Mom from [place].  Would like her WIC to be transferred to [facility] where she is residing now.  Faxed WIC referral to [facility].  Mom to let her RN know when she feels up to using the DEBP which will support her milk supply more fully.  Mom with a very low Hgb of 4.8, to get blood replacement this am.  Mom acting anxious.   Encouraged Mom to hold baby STS when able to go to NICU.   Interventions Interventions: Breast feeding basics reviewed;Skin to skin;Breast massage;Hand express;Hand pump  Lactation Tools Discussed/Used Tools: Pump Breast pump type: Manual WIC Program: Yes   Consult Status Consult Status: Follow-up Date: 04/28/20 Follow-up type: In-patient    [name] 04/27/2020, 8:44 AM

## 2020-04-28 LAB — BASIC METABOLIC PANEL
Anion gap: 7 (ref 5–15)
BUN: 7 mg/dL (ref 6–20)
CO2: 21 mmol/L — ABNORMAL LOW (ref 22–32)
Calcium: 7.3 mg/dL — ABNORMAL LOW (ref 8.9–10.3)
Chloride: 106 mmol/L (ref 98–111)
Creatinine, Ser: 0.78 mg/dL (ref 0.44–1.00)
GFR calc Af Amer: >60 mL/min (ref >60)
GFR calc non Af Amer: >60 mL/min (ref >60)
Glucose, Bld: 94 mg/dL (ref 70–99)
Potassium: 4.1 mmol/L (ref 3.5–5.1)
Sodium: 134 mmol/L — ABNORMAL LOW (ref 135–145)

## 2020-04-28 LAB — PREPARE RBC (CROSSMATCH)

## 2020-04-28 LAB — CBC
HCT: 19.1 % — ABNORMAL LOW (ref 36.0–46.0)
Hemoglobin: 6.6 g/dL — CL (ref 12.0–15.0)
MCH: 31.7 pg (ref 26.0–34.0)
MCHC: 34.6 g/dL (ref 30.0–36.0)
MCV: 91.8 fL (ref 80.0–100.0)
Platelets: 121 10*3/uL — ABNORMAL LOW (ref 150–400)
RBC: 2.08 MIL/uL — ABNORMAL LOW (ref 3.87–5.11)
RDW: 13.9 % (ref 11.5–15.5)
WBC: 12 10*3/uL — ABNORMAL HIGH (ref 4.0–10.5)
nRBC: 0 % (ref 0.0–0.2)

## 2020-04-28 LAB — SURGICAL PATHOLOGY

## 2020-04-28 LAB — HEMOGLOBIN AND HEMATOCRIT, BLOOD
HCT: 22.9 % — ABNORMAL LOW (ref 36.0–46.0)
Hemoglobin: 7.8 g/dL — ABNORMAL LOW (ref 12.0–15.0)

## 2020-04-28 MED ORDER — SODIUM CHLORIDE 0.9% IV SOLUTION: Freq: Once | INTRAVENOUS | Status: AC

## 2020-04-28 MED ORDER — ACETAMINOPHEN 325 MG PO TABS
650.0000 mg | ORAL_TABLET | Freq: Four times a day (QID) | ORAL | Status: DC | PRN
  Administered 2020-04-28 – 2020-04-29 (×3): 650 mg via ORAL
  Filled 2020-04-28 (×3): qty 2

## 2020-04-28 MED ORDER — NIFEDIPINE ER OSMOTIC RELEASE 30 MG PO TB24
60.0000 mg | ORAL_TABLET | Freq: Every day | ORAL | Status: DC
  Administered 2020-04-29: 60 mg via ORAL
  Filled 2020-04-28: qty 2

## 2020-04-28 MED ORDER — NIFEDIPINE ER OSMOTIC RELEASE 30 MG PO TB24
30.0000 mg | ORAL_TABLET | Freq: Once | ORAL | Status: AC
  Administered 2020-04-28: 30 mg via ORAL
  Filled 2020-04-28: qty 1

## 2020-04-28 NOTE — Progress Notes (Signed)
Faculty Attending Note  Post Op Day 2  Subjective: Patient is feeling much better. She reports well controlled pain on PO pain meds. She is ambulating and reports mild light-headedness. Chest pain has resolved. She is voiding spontaneously. She is passing flatus, has not had a BM yet. She is tolerating a regular diet without nausea/vomiting. Bleeding is moderate. She is breast & bottle feeding. Baby is in NICU and doing well.  Objective: Blood pressure (!) 147/89, pulse 82, temperature 98.4 F (36.9 C), temperature source Oral, resp. rate 16, height 5\' 4"  (1.626 m), weight 75.3 kg, SpO2 100 %, unknown if currently breastfeeding. Temp:  [97.9 F (36.6 C)-98.6 F (37 C)] 98.4 F (36.9 C) (09/22 0853) Pulse Rate:  [82-94] 82 (09/22 0853) Resp:  [15-18] 16 (09/22 0853) BP: (121-150)/(72-100) 147/89 (09/22 0853) SpO2:  [99 %-100 %] 100 % (09/22 0853) Weight:  [75.3 kg] 75.3 kg (09/21 2113)  Physical Exam:  General: alert, oriented, cooperative, much less fatigued than yesterday Chest: normal respiratory effort Heart: RRR  Abdomen: soft, appropriately tender to palpation, incision covered by dressing with no evidence of active bleeding  Uterine Fundus: firm, 2 fingers below the umbilicus Lochia: moderate, rubra DVT Evaluation: no evidence of DVT Extremities: no edema, no calf tenderness  UOP: voiding spontaneously  Recent Labs    04/27/20 2244 04/28/20 0655  HGB 7.6* 6.6*  HCT 22.1* 19.1*    Assessment/Plan: Patient Active Problem List   Diagnosis Date Noted  . Acute blood loss anemia 04/27/2020  . Chronic hypertension 04/27/2020  . Chronic hypertension with superimposed pre-eclampsia 04/27/2020  . H/O: C-section 04/27/2020  . Hypokalemia 04/27/2020  . Hypocalcemia 04/27/2020  . Antepartum placental abruption 04/26/2020    Patient is 34 y.o. 38 POD#2 s/p urgent RCS at [redacted]w[redacted]d for placental abruption. Course complicated by cHTN with superimposed pre-eclampsia and  hemorrhage from abruption. S/p magnesium, started on procardia. S/p 3 units pRBCs with H/H this am remains low, will give another unit of PRBC. Hypokalemia has resolved, hyponatremia improved. She is doing much better, recovering appropriately and complains only of mild light-headedness.  Post transfusion CBC Increase procardia to 60 mg daily Continue routine post partum care Pain meds prn Regular diet Lovenox 40 mg daily Braxton depo for birth control Plan for discharge possibly tomorrow   K. [redacted]w[redacted]d, M.D. Attending [facility] (Faculty Practice)  04/28/2020, 9:09 AM

## 2020-04-28 NOTE — Lactation Note (Signed)
This note was copied from a baby's chart. Lactation Consultation Note  Patient Name: Leah Underwood [id number] Date: 04/28/2020 Reason for consult: Follow-up assessment;NICU baby;Late-preterm 34-36.6wks;Other (Comment)   Mom receiving blood.  She has had 4 units.  LC discussed with mom the importance of using the DEBP.  Mom states she doesn't get anything out with the pump but does when she uses her hands.  LC praised her for hand expressing and encouraged her to do that in conjunction with the DEBP.    LC discussed importance of consistency with pumping and to continue even if there's little yield at first.    Mom desired for [facility] to assist with pumping session.  Mom was provided 21 size flange that fit much better.  Mom hand expressed prior to pumping and several drops appeared on the nipple.   Settings and pump usage reviewed.  All questions answered.  LC praised mom for efforts.  Mom was encouraged to pump every 2-3 hours.  Maternal Data Has patient been taught Hand Expression?: Yes  Feeding Feeding Type: Donor Breast Milk  LATCH Score                   Interventions Interventions: DEBP;Hand express;Breast massage (made her a hands free bra)  Lactation Tools Discussed/Used Tools: Flanges Flange Size: 21   Consult Status Consult Status: Follow-up Date: 04/29/20 Follow-up type: In-patient    Maryruth Hancock [facility] 04/28/2020, 12:40 PM

## 2020-04-28 NOTE — Plan of Care (Signed)
  Problem: Education: Goal: Knowledge of General Education information will improve Description: Including pain rating scale, medication(s)/side effects and non-pharmacologic comfort measures Outcome: Completed/Met   Problem: Activity: Goal: Risk for activity intolerance will decrease Outcome: Completed/Met   Problem: Nutrition: Goal: Adequate nutrition will be maintained Outcome: Completed/Met   Problem: Coping: Goal: Level of anxiety will decrease Outcome: Completed/Met   Problem: Elimination: Goal: Will not experience complications related to urinary retention Outcome: Completed/Met   Problem: Education: Goal: Knowledge of condition will improve Outcome: Completed/Met   Problem: Coping: Goal: Ability to identify and utilize available resources and services will improve Outcome: Completed/Met   Problem: Role Relationship: Goal: Ability to demonstrate positive interaction with newborn will improve Outcome: Completed/Met   Problem: Education: Goal: Knowledge of disease or condition will improve Outcome: Completed/Met Goal: Knowledge of the prescribed therapeutic regimen will improve Outcome: Completed/Met

## 2020-04-28 NOTE — Clinical Social Work Maternal (Signed)
CLINICAL SOCIAL WORK MATERNAL/CHILD NOTE  Patient Details  Name: Leah Underwood MRN: [id number] Date of Birth: 03/11/1986  Date:  04/28/2020  Clinical Social Worker Initiating Note:  Kaidence Sant, LCSW Date/Time: Initiated:  04/28/20/1124     Child's Name:  Leah Underwood   Biological Parents:  Mother, Father (Father: Corey Underwood)   Need for Interpreter:  None   Reason for Referral:  Behavioral Health Concerns, Other (Comment) (Infant's NICU admission)   Address:  [address] [place] [address]    Phone number:  [phone] (home)     Additional phone number:   Household Members/Support Persons (HM/SP):   Household Member/Support Person 1, Household Member/Support Person 2, Household Member/Support Person 3, Household Member/Support Person 4   HM/SP Name Relationship DOB or Age  HM/SP -1 Pamela Walker mom    HM/SP -2   mom's fiance    HM/SP -3   grandma    HM/SP -4 Ja'Marii Goyer son 12/20/12  HM/SP -5        HM/SP -6        HM/SP -7        HM/SP -8          Natural Supports (not living in the home):  Immediate Family   Professional Supports: Other (Comment)   Employment: Unemployed   Type of Work:     Education:  Other (comment) (GED)   Homebound arranged:    Financial Resources:  Medicaid   Other Resources:  Food Stamps  (Applying for WIC)   Cultural/Religious Considerations Which May Impact Care:    Strengths:  Ability to meet basic needs , Home prepared for child , Understanding of illness   Psychotropic Medications:         Pediatrician:       Pediatrician List:   [place]    [place]    [place]    [place]    [place]    [place]      Pediatrician Fax Number:    Risk Factors/Current Problems:  None   Cognitive State:  Able to Concentrate , Alert , Goal Oriented , Insightful , Linear Thinking    Mood/Affect:  Calm , Happy , Relaxed , Interested , Comfortable     CSW Assessment: CSW met with MOB at bedside to discuss infant's NICU admission and behavioral health concerns (edinburgh score 8 with answer yes to question 10;; thoughts of harming self). CSW introduced self and explained reason for visit. MOB was welcoming, pleasant and remained engaged during assessment. MOB reported that she moved to [place] and resides with her mom, mom's fiance, grandma and older son. MOB shared that she and her mom care for her grandma. MOB reported that she is unemployed and receives food stamps. MOB reported that she is applying for WIC. MOB reported that she has all items needed to care for infant including a car seat and basinet. CSW inquired about MOB's support system, MOB reported that her mom and sister are supports. MOB reported that FOB will be involved.   CSW provided review of Sudden Infant Death Syndrome (SIDS) precautions. Initially MOB reported that infant will sleep in the bed with her, CSW emphasized the dangers of co-sleeping and MOB verbalized understanding. MOB reported that infant will sleep in basinet.   CSW inquired about MOB's mental health history, MOB reported that she was diagnosed with Bipolar Schizophrenia 2-3 months ago. MOB reported that she is a seeing a psychiatrist Dr.   Casey from [facility] in [place], [place]. MOB reported that she was prescribed medication and unable to recall the name of the medication. MOB reported that she is not taking the medication because it made her feel weird. CSW asked what MOB meant by feeling weird, MOB reported that the medication made her feel "high". CSW inquired about MOB's symptoms, MOB reported mood swings. MOB denied any AVH (auditory/visual hallucinations) and denied any additional symptoms. CSW inquired about what made MOB go see a psychiatrist. MOB shared that she recently experienced a 5 year break up and they were in the process of getting couples therapy. MOB reported that after separating they got help  separately. MOB denied any additional mental health history. CSW inquired about MOB's edinburgh score answering yes to thoughts of harming self. MOB reported that she was upset when she completed edinburgh due to being unable to visit with infant in the NICU due to her medical condition. MOB reported that she was able to visit with infant and is no longer having any SI. MOB reported that she never had a plan. MOB reported that she was feeling sad and crying when she was unable to visit infant in the NICU. CSW acknowledged and validated MOB's feelings. MOB reported that she has been emotional since giving birth which is not her baseline. MOB reported that she cried happy tears after seeing infant. MOB presented calm and did not demonstrate any acute mental health signs/symptoms. MOB presented with limited insight about her mental health history. CSW assessed for safety, MOB denied SI, HI and domestic violence.    CSW provided education regarding the baby blues period vs. perinatal mood disorders, discussed treatment and gave resources for mental health follow up if concerns arise.  CSW recommends self-evaluation during the postpartum time period using the New Mom Checklist from Postpartum Progress and encouraged MOB to contact a medical professional if symptoms are noted at any time.    CSW and MOB discussed infant's NICU admission. CSW informed MOB about the NICU, what to expect and resources/supports available while infant is admitted to the NICU. MOB reported that she feels well informed about infant's care and denied any questions/concerns regarding the NICU. MOB denied any transportation barriers for visiting infant in the NICU. MOB reported that she received adequate prenatal care at [facility]. MOB reported that the hospital has requested her prenatal records.   CSW following CDS due to placental abruption.   CSW will continue to offer resources/supports while infant is admitted to the NICU.   CSW  Plan/Description:  Sudden Infant Death Syndrome (SIDS) Education, Perinatal Mood and Anxiety Disorder (PMADs) Education, Other Patient/Family Education, CSW Will Continue to Monitor Umbilical Cord Tissue Drug Screen Results and Make Report if Warranted    Soriah Leeman L Omolara Carol, LCSW 04/28/2020, 12:38 PM 

## 2020-04-28 NOTE — Care Management (Signed)
CM met with mom in room to discuss discharge needs.  Mom stated that she has moved to [facility] and has applied for [facility] Medicaid.  Mom informed CM that she and her family uses the [place]. [facility] [phone] and that they have used that pharmacy recently and the Medicaid went through.   CM called [address] and spoke to an associate in the pharmacy and they ran a dummy script and it went through.  CM notified RN on unit to change patient's pharmacy for discharge to [facility].  No barriers for medications at discharge or transportation.   Rosita Fire RNC-MNN, BSN Transitions of Care Pediatrics/Women's and [place]

## 2020-04-29 ENCOUNTER — Ambulatory Visit: Payer: Self-pay

## 2020-04-29 ENCOUNTER — Encounter (HOSPITAL_COMMUNITY): Payer: Self-pay | Admitting: Obstetrics and Gynecology

## 2020-04-29 LAB — TYPE AND SCREEN
ABO/RH(D): A POS
Antibody Screen: NEGATIVE
Unit division: 0
Unit division: 0
Unit division: 0
Unit division: 0

## 2020-04-29 LAB — CBC
HCT: 23.1 % — ABNORMAL LOW (ref 36.0–46.0)
Hemoglobin: 7.7 g/dL — ABNORMAL LOW (ref 12.0–15.0)
MCH: 29.7 pg (ref 26.0–34.0)
MCHC: 33.3 g/dL (ref 30.0–36.0)
MCV: 89.2 fL (ref 80.0–100.0)
Platelets: 161 10*3/uL (ref 150–400)
RBC: 2.59 MIL/uL — ABNORMAL LOW (ref 3.87–5.11)
RDW: 14.1 % (ref 11.5–15.5)
WBC: 13.5 10*3/uL — ABNORMAL HIGH (ref 4.0–10.5)
nRBC: 0.1 % (ref 0.0–0.2)

## 2020-04-29 LAB — BPAM RBC
Blood Product Expiration Date: 202110132359
Blood Product Expiration Date: 202110142359
Blood Product Expiration Date: 202110142359
Blood Product Expiration Date: 202110152359
ISSUE DATE / TIME: 202109210827
ISSUE DATE / TIME: 202109211203
ISSUE DATE / TIME: 202109211707
ISSUE DATE / TIME: 202109221007
Unit Type and Rh: 6200
Unit Type and Rh: 6200
Unit Type and Rh: 6200
Unit Type and Rh: 6200

## 2020-04-29 MED ORDER — FERROUS SULFATE 325 (65 FE) MG PO TABS: 325.0000 mg | ORAL_TABLET | Freq: Two times a day (BID) | ORAL | 1 refills | Status: DC

## 2020-04-29 MED ORDER — ZOLPIDEM TARTRATE 5 MG PO TABS: 5.0000 mg | ORAL_TABLET | Freq: Every evening | ORAL | 0 refills | Status: DC | PRN

## 2020-04-29 MED ORDER — IBUPROFEN 600 MG PO TABS
600.0000 mg | ORAL_TABLET | Freq: Four times a day (QID) | ORAL | 0 refills | Status: DC
Start: 2020-04-29 — End: 2020-04-29

## 2020-04-29 MED ORDER — OXYCODONE HCL 5 MG PO TABS: 5.0000 mg | ORAL_TABLET | ORAL | 0 refills | Status: DC | PRN

## 2020-04-29 MED ORDER — IBUPROFEN 600 MG PO TABS: 600.0000 mg | ORAL_TABLET | Freq: Four times a day (QID) | ORAL | 2 refills | Status: AC | PRN

## 2020-04-29 MED ORDER — NIFEDIPINE ER OSMOTIC RELEASE 30 MG PO TB24: 60.0000 mg | ORAL_TABLET | Freq: Every day | ORAL | 1 refills | Status: DC

## 2020-04-29 MED ORDER — ZOLPIDEM TARTRATE 5 MG PO TABS: 5.0000 mg | ORAL_TABLET | Freq: Every evening | ORAL | 0 refills | Status: AC | PRN

## 2020-04-29 MED ORDER — NIFEDIPINE ER 60 MG PO TB24
60.0000 mg | ORAL_TABLET | Freq: Every day | ORAL | 1 refills | Status: DC
Start: 2020-04-29 — End: 2020-04-29

## 2020-04-29 NOTE — Discharge Summary (Signed)
Postpartum Discharge Summary     Patient Name: Leah Underwood DOB: 09-Jun-1986 MRN: [id number]  Date of admission: 04/26/2020 Delivery date:04/26/2020  Delivering provider: Sloan Leiter  Date of discharge: 04/29/2020  Admitting diagnosis: Antepartum placental abruption [O45.90] Intrauterine pregnancy: [redacted]w[redacted]d     Secondary diagnosis:  Principal Problem:   Antepartum placental abruption Active Problems:   Acute blood loss anemia   Chronic hypertension   Chronic hypertension with superimposed pre-eclampsia   H/O: C-section   Hypokalemia   Hypocalcemia  Additional problems: n/a    Discharge diagnosis: Preterm Pregnancy Delivered, Preeclampsia (severe), PPH and placental abruption, acute blood loss anemia                                             Post partum procedures:blood transfusion Augmentation: N/A Complications: Placental Abruption and Hemorrhage>1034mL  Hospital course: Onset of Labor With Unplanned C/S   34 y.o. yo G2P1101 at [redacted]w[redacted]d was admitted with contractions and severe pain, heavy vaginal bleeding. Also chronic hypertension on no meds with severe range BP, presumed severe pre-eclampsia. Reassuring fetal tracing but concern for abruption given significant bleeding. She went for urgent repeat c-section. Complicated by EBL of 1771 mL, most of which was abruption. Delivery details as follows: Membrane Rupture Time/Date: 10:53 AM ,04/26/2020   Delivery Method:C-Section, Low Transverse  Details of operation can be found in separate operative note.  Surgery was uncomplicated. Post partum, she got 24 hrs magnesium therapy and was started on anti-hypertensives, received 4 units pRBCs total with stablization of H/H. She also got depo-provera. By POD#3, she is ambulating,tolerating a regular diet, passing flatus, and urinating well.  Patient is discharged home in stable condition 04/29/20.  Newborn Data: Birth date:04/26/2020  Birth time:10:54 AM  Gender:Female  Living  status:Living  Apgars:4 ,7  Weight:2200 g   Magnesium Sulfate received: Yes: Seizure prophylaxis BMZ received: No Rhophylac:No MMR:No T-DaP:unknown, pt with out of state care Flu: No Transfusion:Yes  Physical exam  Vitals:   04/28/20 2000 04/28/20 2015 04/28/20 2343 04/29/20 0842  BP:  (!) 147/93 139/89 (!) 134/95  Pulse:  93 91 85  Resp:   20 19  Temp:   98.8 F (37.1 C) 98.5 F (36.9 C)  TempSrc:   Oral Oral  SpO2: 100% 100% 99% 99%  Weight:      Height:       General: alert, cooperative and no distress Lochia: appropriate Uterine Fundus: firm Incision: Healing well with no significant drainage DVT Evaluation: No evidence of DVT seen on physical exam. Labs: Lab Results  Component Value Date   WBC 13.5 (H) 04/29/2020   HGB 7.7 (L) 04/29/2020   HCT 23.1 (L) 04/29/2020   MCV 89.2 04/29/2020   PLT 161 04/29/2020   CMP Latest Ref Rng & Units 04/28/2020  Glucose 70 - 99 mg/dL 94  BUN 6 - 20 mg/dL 7  Creatinine 0.44 - 1.00 mg/dL 0.78  Sodium 135 - 145 mmol/L 134(L)  Potassium 3.5 - 5.1 mmol/L 4.1  Chloride 98 - 111 mmol/L 106  CO2 22 - 32 mmol/L 21(L)  Calcium 8.9 - 10.3 mg/dL 7.3(L)  Total Protein 6.5 - 8.1 g/dL -  Total Bilirubin 0.3 - 1.2 mg/dL -  Alkaline Phos 38 - 126 U/L -  AST 15 - 41 U/L -  ALT 0 - 44 U/L -   Edinburgh Score: Flavia Shipper  Postnatal Depression Scale Screening Tool 04/27/2020  I have been able to laugh and see the funny side of things. 0  I have looked forward with enjoyment to things. 0  I have blamed myself unnecessarily when things went wrong. 0  I have been anxious or worried for no good reason. 0  I have felt scared or panicky for no good reason. 2  Things have been getting on top of me. 1  I have been so unhappy that I have had difficulty sleeping. 2  I have felt sad or miserable. 1  I have been so unhappy that I have been crying. 1  The thought of harming myself has occurred to me. 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   ibuprofen 600 MG tablet Commonly known as: ADVIL Take 1 tablet (600 mg total) by mouth every 6 (six) hours.   multivitamin-prenatal 27-0.8 MG Tabs tablet Take 1 tablet by mouth daily at 12 noon.   NIFEdipine 60 MG 24 hr tablet Commonly known as: ADALAT CC Take 1 tablet (60 mg total) by mouth daily.   oxyCODONE 5 MG immediate release tablet Commonly known as: Oxy IR/ROXICODONE Take 1-2 tablets (5-10 mg total) by mouth every 4 (four) hours as needed for moderate pain.   zolpidem 5 MG tablet Commonly known as: AMBIEN Take 1 tablet (5 mg total) by mouth at bedtime as needed for sleep.       Discharge home in stable condition Infant Feeding: Bottle and Breast Infant Disposition:NICU Discharge instruction: per After Visit Summary and Postpartum booklet. Activity: Advance as tolerated. Pelvic rest for 6 weeks.  Diet: routine diet Future Appointments:No future appointments. Follow up Visit:  [facility] [facility].   Specialty: Obstetrics and Gynecology Contact information: [place] [address] [phone]              Please schedule this patient for a In person postpartum visit in 4 weeks with the following provider: MD. Additional Postpartum F/U:Incision check 1 week and BP check 1 week  High risk pregnancy complicated by: HTN Delivery mode:  C-Section, Low Transverse  Anticipated Birth Control:  PP Depo given   04/29/2020 Sloan Leiter, MD

## 2020-04-29 NOTE — Progress Notes (Signed)
Pt requested to be taken to patio for "fresh air." Once on patio, pt attempted to light her lighter. RN informed pt that this is a smoke free campus, pt verbalized understanding stating she did not smoke. Pt attempted to use lighter again to "get nurses attention" and RN asked security to escort pt back to room. Pt states she understands the importance of fire precautions. Provider updated.

## 2020-04-29 NOTE — Progress Notes (Signed)
OB Note Patient sleeping. Will come back later  Cornelia Copa MD Attending Center for [employer] (Faculty Practice) 04/29/2020 Time: [phone]

## 2020-04-29 NOTE — Lactation Note (Signed)
This note was copied from a baby's chart. Lactation Consultation Note  Patient Name: Leah Underwood [id number] Date: 04/29/2020 Reason for consult: Follow-up assessment;Late-preterm 34-36.6wks  Follow up visit to P2 mother of NICU infant. Mother seems to have difficulties with DEBP. Mother demonstrated hand expression and colostrum is easily expressed. Mother tried using manual pump and able to get a few drops. Mother explains she is noticing breast fullness.   Offered to assist with right breast hand expression while mother did left breast. Collected ~15 mL combined in a few minutes. Provided ice packs to be used for 15 minutes at a later time. Mother is going to NICU now. Encouraged mother to keep on using DEBP and/or hand expression every 2-3 hours.   Mother shared she has been stimulating her breasts for a few weeks.   Mother is expecting to be discharged later today. All questions answered at this time.   Encouraged to request LC as needed when in NICU or as needed.     Maternal Data Has patient been taught Hand Expression?: Yes  Feeding Feeding Type: Donor Breast Milk  Interventions Interventions: Breast feeding basics reviewed;Expressed milk;Hand express;DEBP;Hand pump;Ice  Lactation Tools Discussed/Used Tools: Pump Flange Size: 21 Breast pump type: Double-Electric Breast Pump;Manual   Consult Status Consult Status: Follow-up Date: 04/30/20 Follow-up type: In-patient    Ellery Tash A Higuera Ancidey 04/29/2020, 10:18 AM

## 2020-04-29 NOTE — Discharge Instructions (Signed)
Cesarean Delivery, Care After This sheet gives you information about how to care for yourself after your procedure. Your health care provider may also give you more specific instructions. If you have problems or questions, contact your health care provider. What can I expect after the procedure? After the procedure, it is common to have:  A small amount of blood or clear fluid coming from the incision.  Some redness, swelling, and pain in your incision area.  Some abdominal pain and soreness.  Vaginal bleeding (lochia). Even though you did not have a vaginal delivery, you will still have vaginal bleeding and discharge.  Pelvic cramps.  Fatigue. You may have pain, swelling, and discomfort in the tissue between your vagina and your anus (perineum) if:  Your C-section was unplanned, and you were allowed to labor and push.  An incision was made in the area (episiotomy) or the tissue tore during attempted vaginal delivery. Follow these instructions at home: Incision care   Follow instructions from your health care provider about how to take care of your incision. Make sure you: ? Wash your hands with soap and water before you change your bandage (dressing). If soap and water are not available, use hand sanitizer. ? If you have a dressing, change it or remove it as told by your health care provider. ? Leave stitches (sutures), skin staples, skin glue, or adhesive strips in place. These skin closures may need to stay in place for 2 weeks or longer. If adhesive strip edges start to loosen and curl up, you may trim the loose edges. Do not remove adhesive strips completely unless your health care provider tells you to do that.  Check your incision area every day for signs of infection. Check for: ? More redness, swelling, or pain. ? More fluid or blood. ? Warmth. ? Pus or a bad smell.  Do not take baths, swim, or use a hot tub until your health care provider says it's okay. Ask your health  care provider if you can take showers.  When you cough or sneeze, hug a pillow. This helps with pain and decreases the chance of your incision opening up (dehiscing). Do this until your incision heals. Medicines  Take over-the-counter and prescription medicines only as told by your health care provider.  If you were prescribed an antibiotic medicine, take it as told by your health care provider. Do not stop taking the antibiotic even if you start to feel better.  Do not drive or use heavy machinery while taking prescription pain medicine. Lifestyle  Do not drink alcohol. This is especially important if you are breastfeeding or taking pain medicine.  Do not use any products that contain nicotine or tobacco, such as cigarettes, e-cigarettes, and chewing tobacco. If you need help quitting, ask your health care provider. Eating and drinking  Drink at least 8 eight-ounce glasses of water every day unless told not to by your health care provider. If you breastfeed, you may need to drink even more water.  Eat high-fiber foods every day. These foods may help prevent or relieve constipation. High-fiber foods include: ? Whole grain cereals and breads. ? Brown rice. ? Beans. ? Fresh fruits and vegetables. Activity   If possible, have someone help you care for your baby and help with household activities for at least a few days after you leave the hospital.  Return to your normal activities as told by your health care provider. Ask your health care provider what activities are safe for   you.  Rest as much as possible. Try to rest or take a nap while your baby is sleeping.  Do not lift anything that is heavier than 10 lbs (4.5 kg), or the limit that you were told, until your health care provider says that it is safe.  Talk with your health care provider about when you can engage in sexual activity. This may depend on your: ? Risk of infection. ? How fast you heal. ? Comfort and desire to  engage in sexual activity. General instructions  Do not use tampons or douches until your health care provider approves.  Wear loose, comfortable clothing and a supportive and well-fitting bra.  Keep your perineum clean and dry. Wipe from front to back when you use the toilet.  If you pass a blood clot, save it and call your health care provider to discuss. Do not flush blood clots down the toilet before you get instructions from your health care provider.  Keep all follow-up visits for you and your baby as told by your health care provider. This is important. Contact a health care provider if:  You have: ? A fever. ? Bad-smelling vaginal discharge. ? Pus or a bad smell coming from your incision. ? Difficulty or pain when urinating. ? A sudden increase or decrease in the frequency of your bowel movements. ? More redness, swelling, or pain around your incision. ? More fluid or blood coming from your incision. ? A rash. ? Nausea. ? Little or no interest in activities you used to enjoy. ? Questions about caring for yourself or your baby.  Your incision feels warm to the touch.  Your breasts turn red or become painful or hard.  You feel unusually sad or worried.  You vomit.  You pass a blood clot from your vagina.  You urinate more than usual.  You are dizzy or light-headed. Get help right away if:  You have: ? Pain that does not go away or get better with medicine. ? Chest pain. ? Difficulty breathing. ? Blurred vision or spots in your vision. ? Thoughts about hurting yourself or your baby. ? New pain in your abdomen or in one of your legs. ? A severe headache.  You faint.  You bleed from your vagina so much that you fill more than one sanitary pad in one hour. Bleeding should not be heavier than your heaviest period. Summary  After the procedure, it is common to have pain at your incision site, abdominal cramping, and slight bleeding from your vagina.  Check  your incision area every day for signs of infection.  Tell your health care provider about any unusual symptoms.  Keep all follow-up visits for you and your baby as told by your health care provider. This information is not intended to replace advice given to you by your health care provider. Make sure you discuss any questions you have with your health care provider. Document Revised: 01/30/2018 Document Reviewed: 01/30/2018 Elsevier Patient Education  2020 [employer]vier Inc.  

## 2020-04-29 NOTE — Lactation Note (Signed)
This note was copied from a baby's chart. Lactation Consultation Note  Patient Name: Leah Underwood [id number] Date: 04/29/2020  Per mother's request due to pumping difficulties. Mother explains she was able to pump and empty left breast but she is unable to get anything from right breast. Upon assessment, right breast feels firmer and fuller. Talked to mother about using some ice for 15 minutes, massage breast and then pump. In case that fails, hand express until it is comfortable.   Mother verbalized in agreement. Provided ice. Mother is heading home for 1 hour and will call LC as soon as she is back in NICU.     Feeding Feeding Type: Donor Breast Milk  Read Bonelli A Higuera Ancidey 04/29/2020, 4:12 PM

## 2020-04-29 NOTE — Progress Notes (Signed)
Pt ambulated to nicu written  Prescription given to pt  For outside pharmacy

## 2020-04-30 ENCOUNTER — Ambulatory Visit: Payer: Self-pay

## 2020-04-30 ENCOUNTER — Inpatient Hospital Stay (HOSPITAL_COMMUNITY)
Admission: AD | Admit: 2020-04-30 | Discharge: 2020-04-30 | Discharge status: Against Medical Advice | Payer: Medicaid Other | Attending: Obstetrics & Gynecology | Admitting: Obstetrics & Gynecology

## 2020-04-30 NOTE — Lactation Note (Signed)
This note was copied from a baby's chart. Lactation Consultation Note  Patient Name: Leah Underwood [id number] Date: 04/30/2020 Reason for consult: Follow-up assessment;NICU baby;Infant < 6lbs;Late-preterm 34-36.6wks;Engorgement  RN called requesting LC assistance for Mom as she is complaining about engorgement of one breast.  RN instructed Mom to ice for 20 mins between pumping and LC would come assess breasts when she can see Mom.    Mom had just pumped and expressed 60 ml from left breast and 30 ml from right.  Both breasts are soft on palpation, full but not engorged.  Mom instructed to continue with pumping every 2-3 hrs during day and 3-4 hrs at night.  Mom given another hand's free "belly band" pumping bra and heat packs to massage pre and during pumping.    Mom is thrilled as baby lost his PIV today and Mom had her first bowel movement!  Celebration time!  Rejoiced with Mom that her milk is flowing and she is providing breast milk for her baby.    Baby on phototherapy and Mom will continue STS once this is DC'd.   Interventions Interventions: Breast feeding basics reviewed;Skin to skin;Breast massage;Hand express;DEBP;Ice  Lactation Tools Discussed/Used Tools: Pump Breast pump type: Double-Electric Breast Pump   Consult Status Consult Status: Follow-up Date: 05/07/20 Follow-up type: In-patient    [name] 04/30/2020, 12:28 PM

## 2020-05-03 ENCOUNTER — Ambulatory Visit: Payer: Self-pay

## 2020-05-03 NOTE — Lactation Note (Signed)
This note was copied from a baby's chart. Lactation Consultation Note  Patient Name: Leah Underwood [id number] Date: 05/03/2020 Reason for consult: Follow-up assessment;Late-preterm 34-36.6wks;Infant < 6lbs;NICU baby  P2 mother whose infant is now 74 days old.  This is a LPTI at 34+0 weeks with a CGA of 35+0 weeks weighing < 5 lbs and in the NICU.  Mother was at baby's bedside when I arrived.  She was getting ready to leave for a [facility] appointment so our visit was short.    Mother has been pumping and she stated her left breast is much better for milk production than her right breast.  She is obtaining approximately 60-70 mls from the left breast and 30 mls from the right breast.  Observed mother's breasts and they are full but not engorged.  She did have a bruise on her left areola.  Mother informed me that her son latched on that side "for a minute or minute and a half" last night.  Comfort gels with instructions for use given.  She plans to call lactation services when baby starts latching and feeding better because she wants to make sure "everything is right" for her baby.  Praised her for a job well done with pumping and reassured her that lactation will be able to assist her as needed.  Mother had her manual pump and bottles in her backpack and will pump at [facility] if needed.  She stated that she has not pumped since 0500 (almostt 5 hours ago).  She did not have time to pump with the DEBP now because her mother arrived to transport her to her [facility] appointment.  Asked her to pump as soon as she returns and reminded her that she needs to be pumping every three hours.  Mother verbalized understanding and said she "was sleeping."  She will call for any further questions/concerns.    Maternal Data Formula Feeding for Exclusion: No Has patient been taught Hand Expression?: Yes Does the patient have breastfeeding experience prior to this delivery?: Yes  Feeding Feeding Type: Breast Milk  LATCH Score                    Interventions    Lactation Tools Discussed/Used WIC Program: Yes   Consult Status Consult Status: Follow-up Date: 05/04/20 Follow-up type: In-patient    Noele Icenhour R Charle Mclaurin 05/03/2020, 9:53 AM

## 2020-05-06 ENCOUNTER — Ambulatory Visit: Payer: Medicaid Other

## 2020-05-11 ENCOUNTER — Ambulatory Visit: Payer: Self-pay

## 2020-05-11 NOTE — Lactation Note (Signed)
This note was copied from a baby's chart. Lactation Consultation Note  Patient Name: Leah Underwood IRWER'X Date: 05/11/2020   LC attempted to follow up with [name]. The room was dark, and someone was sleeping on the couch. It was unclear who was in the room. LC to follow up at another time.  Walker Shadow 05/11/2020, 2:26 PM

## 2020-05-14 ENCOUNTER — Ambulatory Visit: Payer: Self-pay

## 2020-05-14 NOTE — Lactation Note (Signed)
This note was copied from a baby's chart. Lactation Consultation Note  Patient Name: Leah Underwood [id number] Date: 05/14/2020   Spoke with baby's RN Fannie Knee and Mom not pumping and providing breast milk any more.    [name] 05/14/2020, 10:40 AM

## 2020-05-15 ENCOUNTER — Inpatient Hospital Stay (HOSPITAL_COMMUNITY): Payer: Medicaid Other

## 2020-05-15 ENCOUNTER — Observation Stay (HOSPITAL_COMMUNITY)
Admission: AD | Admit: 2020-05-15 | Discharge: 2020-05-16 | Disposition: A | Discharge status: Home / Self Care | Payer: Medicaid Other | Attending: General Surgery | Admitting: General Surgery

## 2020-05-15 ENCOUNTER — Other Ambulatory Visit: Payer: Self-pay

## 2020-05-15 DIAGNOSIS — Z20822 Contact with and (suspected) exposure to covid-19: Secondary | ICD-10-CM | POA: Insufficient documentation

## 2020-05-15 DIAGNOSIS — O165 Unspecified maternal hypertension, complicating the puerperium: Secondary | ICD-10-CM | POA: Diagnosis not present

## 2020-05-15 DIAGNOSIS — R109 Unspecified abdominal pain: Secondary | ICD-10-CM

## 2020-05-15 DIAGNOSIS — K810 Acute cholecystitis: Principal | ICD-10-CM | POA: Insufficient documentation

## 2020-05-15 DIAGNOSIS — Z79899 Other long term (current) drug therapy: Secondary | ICD-10-CM | POA: Insufficient documentation

## 2020-05-15 DIAGNOSIS — R1011 Right upper quadrant pain: Secondary | ICD-10-CM | POA: Diagnosis present

## 2020-05-15 DIAGNOSIS — J45909 Unspecified asthma, uncomplicated: Secondary | ICD-10-CM | POA: Insufficient documentation

## 2020-05-15 DIAGNOSIS — Z87891 Personal history of nicotine dependence: Secondary | ICD-10-CM | POA: Diagnosis not present

## 2020-05-15 DIAGNOSIS — K819 Cholecystitis, unspecified: Secondary | ICD-10-CM | POA: Diagnosis not present

## 2020-05-15 DIAGNOSIS — O9089 Other complications of the puerperium, not elsewhere classified: Secondary | ICD-10-CM

## 2020-05-15 DIAGNOSIS — Z91010 Allergy to peanuts: Secondary | ICD-10-CM | POA: Insufficient documentation

## 2020-05-15 LAB — COMPREHENSIVE METABOLIC PANEL
ALT: 10 U/L (ref 0–44)
AST: 14 U/L — ABNORMAL LOW (ref 15–41)
Albumin: 2.9 g/dL — ABNORMAL LOW (ref 3.5–5.0)
Alkaline Phosphatase: 59 U/L (ref 38–126)
Anion gap: 9 (ref 5–15)
BUN: 8 mg/dL (ref 6–20)
CO2: 23 mmol/L (ref 22–32)
Calcium: 8.7 mg/dL — ABNORMAL LOW (ref 8.9–10.3)
Chloride: 113 mmol/L — ABNORMAL HIGH (ref 98–111)
Creatinine, Ser: 0.8 mg/dL (ref 0.44–1.00)
GFR, Estimated: >60 mL/min (ref >60)
Glucose, Bld: 85 mg/dL (ref 70–99)
Potassium: 3.8 mmol/L (ref 3.5–5.1)
Sodium: 145 mmol/L (ref 135–145)
Total Bilirubin: 0.3 mg/dL (ref 0.3–1.2)
Total Protein: 6.3 g/dL — ABNORMAL LOW (ref 6.5–8.1)

## 2020-05-15 LAB — CBC WITH DIFFERENTIAL/PLATELET
Abs Immature Granulocytes: 0.01 10*3/uL (ref 0.00–0.07)
Basophils Absolute: 0 10*3/uL (ref 0.0–0.1)
Basophils Relative: 0 %
Eosinophils Absolute: 0.1 10*3/uL (ref 0.0–0.5)
Eosinophils Relative: 1 %
HCT: 33.7 % — ABNORMAL LOW (ref 36.0–46.0)
Hemoglobin: 10.7 g/dL — ABNORMAL LOW (ref 12.0–15.0)
Immature Granulocytes: 0 %
Lymphocytes Relative: 18 %
Lymphs Abs: 1.3 10*3/uL (ref 0.7–4.0)
MCH: 30.1 pg (ref 26.0–34.0)
MCHC: 31.8 g/dL (ref 30.0–36.0)
MCV: 94.7 fL (ref 80.0–100.0)
Monocytes Absolute: 0.5 10*3/uL (ref 0.1–1.0)
Monocytes Relative: 7 %
Neutro Abs: 5.2 10*3/uL (ref 1.7–7.7)
Neutrophils Relative %: 74 %
Platelets: 386 10*3/uL (ref 150–400)
RBC: 3.56 MIL/uL — ABNORMAL LOW (ref 3.87–5.11)
RDW: 14.7 % (ref 11.5–15.5)
WBC: 7.1 10*3/uL (ref 4.0–10.5)
nRBC: 0 % (ref 0.0–0.2)

## 2020-05-15 LAB — LIPASE, BLOOD: Lipase: 33 U/L (ref 11–51)

## 2020-05-15 LAB — PROTEIN / CREATININE RATIO, URINE
Creatinine, Urine: 542.12 mg/dL
Protein Creatinine Ratio: 0.1 mg/mg{Cre} (ref 0.00–0.15)
Total Protein, Urine: 55 mg/dL

## 2020-05-15 LAB — RESPIRATORY PANEL BY RT PCR (FLU A&B, COVID)
Influenza A by PCR: NEGATIVE
Influenza B by PCR: NEGATIVE
SARS Coronavirus 2 by RT PCR: NEGATIVE

## 2020-05-15 MED ORDER — HYDROMORPHONE HCL 1 MG/ML IJ SOLN
1.0000 mg | Freq: Once | INTRAMUSCULAR | Status: AC
  Administered 2020-05-15: 1 mg via INTRAVENOUS
  Filled 2020-05-15: qty 1

## 2020-05-15 MED ORDER — HYDRALAZINE HCL 20 MG/ML IJ SOLN
10.0000 mg | Freq: Once | INTRAMUSCULAR | Status: AC
  Administered 2020-05-15: 10 mg via INTRAVENOUS
  Filled 2020-05-15: qty 1

## 2020-05-15 MED ORDER — IBUPROFEN 600 MG PO TABS
600.0000 mg | ORAL_TABLET | Freq: Once | ORAL | Status: AC
  Administered 2020-05-15: 600 mg via ORAL
  Filled 2020-05-15: qty 1

## 2020-05-15 MED ORDER — NIFEDIPINE 10 MG PO CAPS
20.0000 mg | ORAL_CAPSULE | ORAL | Status: DC | PRN
  Filled 2020-05-15: qty 2

## 2020-05-15 MED ORDER — ACETAMINOPHEN 500 MG PO TABS
1000.0000 mg | ORAL_TABLET | Freq: Once | ORAL | Status: AC
  Administered 2020-05-15: 1000 mg via ORAL
  Filled 2020-05-15: qty 2

## 2020-05-15 MED ORDER — HYDRALAZINE HCL 20 MG/ML IJ SOLN
10.0000 mg | INTRAMUSCULAR | Status: DC | PRN
  Administered 2020-05-16: 2.5 mg via INTRAVENOUS

## 2020-05-15 MED ORDER — SIMETHICONE 80 MG PO CHEW
160.0000 mg | CHEWABLE_TABLET | Freq: Once | ORAL | Status: AC
  Administered 2020-05-15: 160 mg via ORAL
  Filled 2020-05-15: qty 2

## 2020-05-15 MED ORDER — LABETALOL HCL 5 MG/ML IV SOLN
80.0000 mg | INTRAVENOUS | Status: DC | PRN
  Administered 2020-05-15: 80 mg via INTRAVENOUS
  Filled 2020-05-15: qty 16

## 2020-05-15 MED ORDER — LABETALOL HCL 5 MG/ML IV SOLN
40.0000 mg | INTRAVENOUS | Status: DC | PRN
  Administered 2020-05-15: 40 mg via INTRAVENOUS
  Filled 2020-05-15 (×2): qty 8

## 2020-05-15 MED ORDER — NIFEDIPINE 10 MG PO CAPS
10.0000 mg | ORAL_CAPSULE | ORAL | Status: DC | PRN
  Administered 2020-05-15: 10 mg via ORAL
  Filled 2020-05-15 (×2): qty 1

## 2020-05-15 MED ORDER — OXYCODONE-ACETAMINOPHEN 5-325 MG PO TABS
1.0000 | ORAL_TABLET | Freq: Once | ORAL | Status: AC
  Administered 2020-05-15: 1 via ORAL
  Filled 2020-05-15: qty 1

## 2020-05-15 MED ORDER — LABETALOL HCL 5 MG/ML IV SOLN
20.0000 mg | INTRAVENOUS | Status: DC | PRN
  Administered 2020-05-15 – 2020-05-16 (×2): 20 mg via INTRAVENOUS
  Administered 2020-05-16: 10 mg via INTRAVENOUS
  Filled 2020-05-15 (×2): qty 4

## 2020-05-15 MED ORDER — LABETALOL HCL 5 MG/ML IV SOLN
40.0000 mg | INTRAVENOUS | Status: DC | PRN
  Filled 2020-05-15: qty 8

## 2020-05-15 NOTE — MAU Provider Note (Signed)
History     CSN: [id number]  Arrival date and time: 05/15/20 1307   First Provider Initiated Contact with Patient 05/15/20 1430      Chief Complaint  Patient presents with  . Shortness of Breath  . Abdominal Pain   HPI  [name] is a 34 y.o. female G2P1101 status post repeat c-section on 9/20 2/2 placenta abruption/ superimposed  preeclampsia.  Reports upper abdominal pain that started 2 weeks ago. Reports the pain was mild initially and now the pain is severe. She feels the pain worse in the middle of her upper abdomen and in the RUQ. She reports her pain 10/10. She has a history of Preeclampsia and was sent home with procardia and reports taking 30 mg PO daily. She has no headaches or vision changes currently. She had a HA yesterday and took ibuprofen which made the pain go away. The pain in her abdomen comes and goes. When the pain is severe she cannot move. She reports eating a sausage, egg, and cheese biscuit for breakfast. Symptoms worsened after eating.  1 episode of vomiting yesterday.  None today.   OB History    Gravida  2   Para  2   Term  1   Preterm  1   AB      Living  1     SAB      TAB      Ectopic      Multiple  0   Live Births  1           Past Medical History:  Diagnosis Date  . Asthma   . Bronchitis   . Pre-eclampsia 2021   Occurred during both pregnancies  . Smoker     Past Surgical History:  Procedure Laterality Date  . CESAREAN SECTION  2021   Both pregnancies  . CESAREAN SECTION N/A 04/26/2020   Procedure: CESAREAN SECTION;  Surgeon: Conan Bowens, MD;  Location: [facility] LD ORS;  Service: Obstetrics;  Laterality: N/A;    No family history on file.  Social History   Tobacco Use  . Smoking status: Smoker, Current Status Unknown  . Smokeless tobacco: Never Used  Substance Use Topics  . Alcohol use: Not Currently  . Drug use: Yes    Types: Marijuana    Allergies:  Allergies  Allergen Reactions  . Peach [Prunus  Persica] Anaphylaxis  . Peanut-Containing Drug Products Anaphylaxis  . Tomato Anaphylaxis    Medications Prior to Admission  Medication Sig Dispense Refill Last Dose  . ferrous sulfate (FERROUSUL) 325 (65 FE) MG tablet Take 1 tablet (325 mg total) by mouth 2 (two) times daily. 60 tablet 1   . ibuprofen (ADVIL) 600 MG tablet Take 1 tablet (600 mg total) by mouth every 6 (six) hours as needed for headache, mild pain, moderate pain or cramping. 30 tablet 2   . NIFEdipine (PROCARDIA-XL/NIFEDICAL-XL) 30 MG 24 hr tablet Take 2 tablets (60 mg total) by mouth daily. Can increase to twice a day as needed for symptomatic contractions 60 tablet 1   . oxyCODONE (ROXICODONE) 5 MG immediate release tablet Take 1 tablet (5 mg total) by mouth every 4 (four) hours as needed for severe pain. 30 tablet 0   . Prenatal Vit-Fe Fumarate-FA (MULTIVITAMIN-PRENATAL) 27-0.8 MG TABS tablet Take 1 tablet by mouth daily at 12 noon.     Marland Kitchen zolpidem (AMBIEN) 5 MG tablet Take 1 tablet (5 mg total) by mouth at bedtime as needed for sleep. 5  tablet 0    Results for orders placed or performed during the hospital encounter of 05/15/20 (from the past 48 hour(s))  CBC with Differential/Platelet     Status: Abnormal   Collection Time: 05/15/20  2:41 PM  Result Value Ref Range   WBC 7.1 4.0 - 10.5 K/uL   RBC 3.56 (L) 3.87 - 5.11 MIL/uL   Hemoglobin 10.7 (L) 12.0 - 15.0 g/dL   HCT 41.333.7 (L) 36 - 46 %   MCV 94.7 80.0 - 100.0 fL   MCH 30.1 26.0 - 34.0 pg   MCHC 31.8 30.0 - 36.0 g/dL   RDW 24.414.7 01.011.5 - 27.215.5 %   Platelets 386 150 - 400 K/uL   nRBC 0.0 0.0 - 0.2 %   Neutrophils Relative % 74 %   Neutro Abs 5.2 1.7 - 7.7 K/uL   Lymphocytes Relative 18 %   Lymphs Abs 1.3 0.7 - 4.0 K/uL   Monocytes Relative 7 %   Monocytes Absolute 0.5 0.1 - 1.0 K/uL   Eosinophils Relative 1 %   Eosinophils Absolute 0.1 0 - 0 K/uL   Basophils Relative 0 %   Basophils Absolute 0.0 0 - 0 K/uL   Immature Granulocytes 0 %   Abs Immature Granulocytes  0.01 0.00 - 0.07 K/uL    Comment: Performed at [facility], [address] [place], [place] [address]  Comprehensive metabolic panel     Status: Abnormal   Collection Time: 05/15/20  2:41 PM  Result Value Ref Range   Sodium 145 135 - 145 mmol/L   Potassium 3.8 3.5 - 5.1 mmol/L   Chloride 113 (H) 98 - 111 mmol/L   CO2 23 22 - 32 mmol/L   Glucose, Bld 85 70 - 99 mg/dL    Comment: Glucose reference range applies only to samples taken after fasting for at least 8 hours.   BUN 8 6 - 20 mg/dL   Creatinine, Ser 4.030.80 0.44 - 1.00 mg/dL   Calcium 8.7 (L) 8.9 - 10.3 mg/dL   Total Protein 6.3 (L) 6.5 - 8.1 g/dL   Albumin 2.9 (L) 3.5 - 5.0 g/dL   AST 14 (L) 15 - 41 U/L   ALT 10 0 - 44 U/L   Alkaline Phosphatase 59 38 - 126 U/L   Total Bilirubin 0.3 0.3 - 1.2 mg/dL   GFR, Estimated >47>60 >42>60 mL/min   Anion gap 9 5 - 15    Comment: Performed at [facility], [address] [place], [place] [address]  Protein / creatinine ratio, urine     Status: None   Collection Time: 05/15/20  2:41 PM  Result Value Ref Range   Creatinine, Urine 542.12 mg/dL    Comment: RESULTS CONFIRMED BY MANUAL DILUTION   Total Protein, Urine 55 mg/dL    Comment: NO NORMAL RANGE ESTABLISHED FOR THIS TEST   Protein Creatinine Ratio 0.10 0.00 - 0.15 mg/mg[Cre]    Comment: Performed at [facility], [address] [place], [place] [address]  Lipase, blood     Status: None   Collection Time: 05/15/20  2:41 PM  Result Value Ref Range   Lipase 33 11 - 51 U/L    Comment: Performed at [facility], [address] [place], [place] [address]   DG Abd 2 Views  Result Date: 05/15/2020 CLINICAL DATA:  Abdominal pain.  Recent C-section. EXAM: ABDOMEN - 2 VIEW COMPARISON:  None. FINDINGS: The bowel gas pattern is normal. There  is no free intraperitoneal air. There are no suspicious abdominal calcifications. Linear densities projecting over the upper abdomen are presumably related to overlying clothing which  was not removed. No acute osseous findings are seen. IMPRESSION: No acute abdominal findings. Presumed external artifact overlying the upper abdomen. Electronically Signed   By: Carey Bullocks M.D.   On: 05/15/2020 15:39   US Abdomen Limited RUQ  Result Date: 05/15/2020 CLINICAL DATA:  Right upper quadrant abdominal pain. Left lower quadrant pain. Recent cesarean section. EXAM: ULTRASOUND ABDOMEN LIMITED RIGHT UPPER QUADRANT COMPARISON:  Radiographs same date. FINDINGS: Gallbladder: Echogenic shadowing structure in the subhepatic space most consistent with a gallbladder filled with stones (wall echo shadow sign). No gallbladder wall gas identified on preceding radiographs. Borderline gallbladder wall thickening with reported sonographic Murphy sign. No pericholecystic fluid. Common bile duct: Diameter: 5 mm Liver: No focal lesion identified. Within normal limits in parenchymal echogenicity. Portal vein is patent on color Doppler imaging with normal direction of blood flow towards the liver. Other: None. IMPRESSION: 1. Cholelithiasis with borderline gallbladder wall thickening and positive sonographic Murphy sign, consistent with acute cholecystitis. 2. No biliary dilatation or focal hepatic abnormality. Electronically Signed   By: Carey Bullocks M.D.   On: 05/15/2020 16:34    Review of Systems  Eyes: Negative for photophobia and visual disturbance.  Neurological: Negative for headaches.   Physical Exam   Blood pressure (!) 160/108, pulse 91, temperature 98.8 F (37.1 C), temperature source Oral, resp. rate 18, SpO2 100 %, unknown if currently breastfeeding.   Patient Vitals for the past 24 hrs:  BP Temp Temp src Pulse Resp SpO2  05/15/20 2016 (!) 156/103 -- -- 81 -- --  05/15/20 2001 (!) 162/117 -- -- 82 -- --  05/15/20 1956 (!) 154/106 -- -- 76 -- --  05/15/20 1941 (!) 155/109 -- -- 78 -- --  05/15/20 1930 (!) 160/108 -- -- 91 -- --  05/15/20 1906 (!) 155/113 -- -- 89 -- --  05/15/20 1839  (!) 163/111 -- -- 90 -- --  05/15/20 1752 (!) 163/106 -- -- 89 -- --  05/15/20 1641 (!) 150/107 -- -- (!) 105 -- --  05/15/20 1640 (!) 148/96 -- -- 97 -- --  05/15/20 1555 (!) 153/100 -- -- (!) 102 -- --  05/15/20 1502 (!) 157/109 -- -- 98 -- --  05/15/20 1445 (!) 170/103 -- -- 90 -- 100 %  05/15/20 1430 (!) 184/123 -- -- 87 -- --  05/15/20 1425 -- -- -- -- -- 99 %  05/15/20 1420 (!) 182/118 -- -- 81 -- --  05/15/20 1410 (!) 183/116 -- -- 79 -- 100 %  05/15/20 1408 (!) 178/116 -- -- 80 -- --  05/15/20 1354 (!) 181/117 98.8 F (37.1 C) Oral 89 18 100 %    Physical Exam Nursing note reviewed.  Constitutional:      Appearance: She is well-developed. She is ill-appearing. She is not toxic-appearing or diaphoretic.  HENT:     Head: Normocephalic.  Abdominal:     Palpations: Abdomen is soft.     Tenderness: There is abdominal tenderness in the right upper quadrant. There is guarding. There is no right CVA tenderness or rebound. Positive signs include Murphy's sign.     Hernia: No hernia is present.  Neurological:     Mental Status: She is alert.  Psychiatric:        Mood and Affect: Mood is anxious.     MAU Course  Procedures  None  MDM  Upon arrival patient was thrashing in bed with pain. Reports severe right upper quad pain.  Procardia 10 mg PO  IV established  Dilaudid 1 mg IV given  Severe range BP's on arrival, labetalol protocol initiated.  PIH labs collected Abdominal xray & RUQ [place] ordered  1800: spoke to Dr. Janee Morn with general surgery, regarding RUQ [place] results showing cholysistits  Discussed patient with Dr. Jolayne Panther;  Made her aware of BP readings and [place] results. She recommends hydralazine 10 mg IV x 1 1930: updated Dr. Jolayne Panther on patient's status. Patient's pain back up to 10/10; BP back up to severe range likely 2/2 pain. Dilaudid 1 mg ordered IV with labetalol 20 mg IV. Will attempt oral pain medication. Waiting for general surgery to see the patient. Patient  requesting to Leave.  Dr. Janee Morn in MAU to admit patient, and discuss plan of care.   Assessment and Plan   A:  1. Cholecystitis   2. Abdominal pain   3. RUQ pain   4. Postpartum complication   5. Postpartum hypertension     P:  Admit per General surgery Consult OB if needed for BP management; Plan to resume procardia 30 mg XL BID.   Duane Lope, NP 05/15/2020 9:26 PM

## 2020-05-15 NOTE — MAU Note (Signed)
Pt reports to mau with c/o lower abd pain that radiates to upper right quad.  Pt states she has also been short of breath for the past few days.  Pt also c/o headache that comes and goes.

## 2020-05-15 NOTE — MAU Note (Signed)
RN attempted to perform IV start and lab draw. Patient declining at this time. Saying that the issue is not her BP; its the pain in her abdomen. States she can feel the gas moving. Venia Carbon NP in dept and made aware.   Per patient; her blurry vision and headache are a result of where she has been crying the last couple of days and not related to her blood pressure.

## 2020-05-15 NOTE — H&P (Signed)
Leah Underwood is an 34 y.o. female.   Chief Complaint: RUQ pain HPI: 34yo F status post cesarean section on 9/20 presents to the MAU complaining of right upper quadrant and epigastric pain.  She has had 2 other similar episodes since her baby was born.  Today, the pain returned and was quite severe.  The pain is in her right upper quadrant and radiates to her epigastrium.  She underwent evaluation in the MAU.  Liver function tests are unremarkable.  White count is normal.  Ultrasound is consistent with acute cholecystitis and I was asked to see her for admission.  She continues to have pain.  She has been treated for hypertension in the MAU.  She takes Procardia at home but has been taking a lower dose than prescribed.  Past Medical History:  Diagnosis Date  . Asthma   . Bronchitis   . Pre-eclampsia 2021   Occurred during both pregnancies  . Smoker     Past Surgical History:  Procedure Laterality Date  . CESAREAN SECTION  2021   Both pregnancies  . CESAREAN SECTION N/A 04/26/2020   Procedure: CESAREAN SECTION;  Surgeon: Conan Bowens, MD;  Location: [facility] LD ORS;  Service: Obstetrics;  Laterality: N/A;    No family history on file. Social History:  reports that she has been smoking. She has never used smokeless tobacco. She reports previous alcohol use. She reports current drug use. Drug: Marijuana.  Allergies:  Allergies  Allergen Reactions  . Peach [Prunus Persica] Anaphylaxis  . Peanut-Containing Drug Products Anaphylaxis  . Tomato Anaphylaxis    Medications Prior to Admission  Medication Sig Dispense Refill  . ferrous sulfate (FERROUSUL) 325 (65 FE) MG tablet Take 1 tablet (325 mg total) by mouth 2 (two) times daily. 60 tablet 1  . ibuprofen (ADVIL) 600 MG tablet Take 1 tablet (600 mg total) by mouth every 6 (six) hours as needed for headache, mild pain, moderate pain or cramping. 30 tablet 2  . NIFEdipine (PROCARDIA-XL/NIFEDICAL-XL) 30 MG 24 hr tablet Take 2 tablets (60 mg  total) by mouth daily. Can increase to twice a day as needed for symptomatic contractions 60 tablet 1  . oxyCODONE (ROXICODONE) 5 MG immediate release tablet Take 1 tablet (5 mg total) by mouth every 4 (four) hours as needed for severe pain. 30 tablet 0  . Prenatal Vit-Fe Fumarate-FA (MULTIVITAMIN-PRENATAL) 27-0.8 MG TABS tablet Take 1 tablet by mouth daily at 12 noon.    Marland Kitchen zolpidem (AMBIEN) 5 MG tablet Take 1 tablet (5 mg total) by mouth at bedtime as needed for sleep. 5 tablet 0    Results for orders placed or performed during the hospital encounter of 05/15/20 (from the past 48 hour(s))  CBC with Differential/Platelet     Status: Abnormal   Collection Time: 05/15/20  2:41 PM  Result Value Ref Range   WBC 7.1 4.0 - 10.5 K/uL   RBC 3.56 (L) 3.87 - 5.11 MIL/uL   Hemoglobin 10.7 (L) 12.0 - 15.0 g/dL   HCT 22.0 (L) 36 - 46 %   MCV 94.7 80.0 - 100.0 fL   MCH 30.1 26.0 - 34.0 pg   MCHC 31.8 30.0 - 36.0 g/dL   RDW 25.4 27.0 - 62.3 %   Platelets 386 150 - 400 K/uL   nRBC 0.0 0.0 - 0.2 %   Neutrophils Relative % 74 %   Neutro Abs 5.2 1.7 - 7.7 K/uL   Lymphocytes Relative 18 %   Lymphs Abs 1.3 0.7 -  4.0 K/uL   Monocytes Relative 7 %   Monocytes Absolute 0.5 0.1 - 1.0 K/uL   Eosinophils Relative 1 %   Eosinophils Absolute 0.1 0 - 0 K/uL   Basophils Relative 0 %   Basophils Absolute 0.0 0 - 0 K/uL   Immature Granulocytes 0 %   Abs Immature Granulocytes 0.01 0.00 - 0.07 K/uL    Comment: Performed at [facility], [address], [place], [place] [address]  Comprehensive metabolic panel     Status: Abnormal   Collection Time: 05/15/20  2:41 PM  Result Value Ref Range   Sodium 145 135 - 145 mmol/L   Potassium 3.8 3.5 - 5.1 mmol/L   Chloride 113 (H) 98 - 111 mmol/L   CO2 23 22 - 32 mmol/L   Glucose, Bld 85 70 - 99 mg/dL    Comment: Glucose reference range applies only to samples taken after fasting for at least 8 hours.   BUN 8 6 - 20 mg/dL   Creatinine, Ser 1.91 0.44 - 1.00 mg/dL    Calcium 8.7 (L) 8.9 - 10.3 mg/dL   Total Protein 6.3 (L) 6.5 - 8.1 g/dL   Albumin 2.9 (L) 3.5 - 5.0 g/dL   AST 14 (L) 15 - 41 U/L   ALT 10 0 - 44 U/L   Alkaline Phosphatase 59 38 - 126 U/L   Total Bilirubin 0.3 0.3 - 1.2 mg/dL   GFR, Estimated >47 >82 mL/min   Anion gap 9 5 - 15    Comment: Performed at [facility], [address], [place], [place] [address]  Protein / creatinine ratio, urine     Status: None   Collection Time: 05/15/20  2:41 PM  Result Value Ref Range   Creatinine, Urine 542.12 mg/dL    Comment: RESULTS CONFIRMED BY MANUAL DILUTION   Total Protein, Urine 55 mg/dL    Comment: NO NORMAL RANGE ESTABLISHED FOR THIS TEST   Protein Creatinine Ratio 0.10 0.00 - 0.15 mg/mg[Cre]    Comment: Performed at [facility], [address], [place], [place] [address]  Lipase, blood     Status: None   Collection Time: 05/15/20  2:41 PM  Result Value Ref Range   Lipase 33 11 - 51 U/L    Comment: Performed at [facility], [address], [place], [place] [address]   DG Abd 2 Views  Result Date: 05/15/2020 CLINICAL DATA:  Abdominal pain.  Recent C-section. EXAM: ABDOMEN - 2 VIEW COMPARISON:  None. FINDINGS: The bowel gas pattern is normal. There is no free intraperitoneal air. There are no suspicious abdominal calcifications. Linear densities projecting over the upper abdomen are presumably related to overlying clothing which was not removed. No acute osseous findings are seen. IMPRESSION: No acute abdominal findings. Presumed external artifact overlying the upper abdomen. Electronically Signed   By: Carey Bullocks M.D.   On: 05/15/2020 15:39   US Abdomen Limited RUQ  Result Date: 05/15/2020 CLINICAL DATA:  Right upper quadrant abdominal pain. Left lower quadrant pain. Recent cesarean section. EXAM: ULTRASOUND ABDOMEN LIMITED RIGHT UPPER QUADRANT COMPARISON:  Radiographs same date. FINDINGS: Gallbladder: Echogenic shadowing structure in the subhepatic space most  consistent with a gallbladder filled with stones (wall echo shadow sign). No gallbladder wall gas identified on preceding radiographs. Borderline gallbladder wall thickening with reported sonographic Murphy sign. No pericholecystic fluid. Common bile duct: Diameter: 5 mm Liver: No focal lesion identified. Within normal limits in parenchymal echogenicity. Portal  vein is patent on color Doppler imaging with normal direction of blood flow towards the liver. Other: None. IMPRESSION: 1. Cholelithiasis with borderline gallbladder wall thickening and positive sonographic Murphy sign, consistent with acute cholecystitis. 2. No biliary dilatation or focal hepatic abnormality. Electronically Signed   By: Carey Bullocks M.D.   On: 05/15/2020 16:34    Review of Systems  Constitutional: Negative.   HENT: Negative.   Eyes: Negative.   Respiratory: Negative.   Cardiovascular: Negative.   Gastrointestinal: Positive for abdominal pain and nausea.  Endocrine: Negative.   Genitourinary: Negative.   Musculoskeletal: Negative.   Skin: Negative.   Allergic/Immunologic: Negative.   Neurological: Negative.   Hematological: Negative.   Psychiatric/Behavioral: Negative.     Blood pressure (!) 156/103, pulse 81, temperature 98.8 F (37.1 C), temperature source Oral, resp. rate 18, SpO2 100 %, unknown if currently breastfeeding. Physical Exam Constitutional:      General: She is not in acute distress.    Appearance: She is well-developed.  HENT:     Head: Normocephalic.     Mouth/Throat:     Mouth: Mucous membranes are moist.     Pharynx: Oropharynx is clear.  Eyes:     Pupils: Pupils are equal, round, and reactive to light.  Cardiovascular:     Rate and Rhythm: Normal rate and regular rhythm.  Pulmonary:     Effort: Pulmonary effort is normal. No tachypnea or respiratory distress.     Breath sounds: Normal breath sounds.  Chest:     Chest wall: No deformity.  Abdominal:     Palpations: Abdomen is  soft.     Tenderness: There is abdominal tenderness.     Comments: Tenderness in the right upper quadrant without guarding  Musculoskeletal:        General: Normal range of motion.     Cervical back: Normal range of motion.  Skin:    General: Skin is warm and dry.  Neurological:     Mental Status: She is alert and oriented to person, place, and time.     Motor: No weakness.  Psychiatric:        Mood and Affect: Mood normal.      Assessment/Plan Cholecystitis - NPO, IV Rocephin, will plan laparoscopic cholecystectomy with cholangiogram this admission, possibly tomorrow.  I discussed the procedure, risks, and benefits with her.  I answered her questions.  She is agreeable and I will discuss with Dr. Bedelia Person tomorrow.  Hypertension - PRN labetalol  Liz Malady, MD 05/15/2020, 8:36 PM

## 2020-05-15 NOTE — MAU Note (Signed)
Patient returned to room. 

## 2020-05-15 NOTE — MAU Note (Signed)
Patient entered hallway and informed staff that she needed to step out for a minute and that she would be back.

## 2020-05-16 ENCOUNTER — Observation Stay (HOSPITAL_COMMUNITY): Payer: Medicaid Other | Admitting: Certified Registered Nurse Anesthetist

## 2020-05-16 ENCOUNTER — Encounter (HOSPITAL_COMMUNITY): Payer: Self-pay | Admitting: General Surgery

## 2020-05-16 ENCOUNTER — Encounter (HOSPITAL_COMMUNITY): Admission: AD | Disposition: A | Discharge status: Home / Self Care | Payer: Self-pay | Source: Home / Self Care

## 2020-05-16 DIAGNOSIS — Z87891 Personal history of nicotine dependence: Secondary | ICD-10-CM | POA: Diagnosis not present

## 2020-05-16 DIAGNOSIS — J45909 Unspecified asthma, uncomplicated: Secondary | ICD-10-CM | POA: Diagnosis not present

## 2020-05-16 DIAGNOSIS — Z20822 Contact with and (suspected) exposure to covid-19: Secondary | ICD-10-CM | POA: Diagnosis not present

## 2020-05-16 DIAGNOSIS — K810 Acute cholecystitis: Secondary | ICD-10-CM | POA: Diagnosis not present

## 2020-05-16 HISTORY — PX: CHOLECYSTECTOMY: SHX55

## 2020-05-16 LAB — COMPREHENSIVE METABOLIC PANEL
ALT: 10 U/L (ref 0–44)
AST: 13 U/L — ABNORMAL LOW (ref 15–41)
Albumin: 2.7 g/dL — ABNORMAL LOW (ref 3.5–5.0)
Alkaline Phosphatase: 47 U/L (ref 38–126)
Anion gap: 10 (ref 5–15)
BUN: <5 mg/dL — ABNORMAL LOW (ref 6–20)
CO2: 20 mmol/L — ABNORMAL LOW (ref 22–32)
Calcium: 8.3 mg/dL — ABNORMAL LOW (ref 8.9–10.3)
Chloride: 109 mmol/L (ref 98–111)
Creatinine, Ser: 0.67 mg/dL (ref 0.44–1.00)
GFR, Estimated: >60 mL/min (ref >60)
Glucose, Bld: 88 mg/dL (ref 70–99)
Potassium: 3.3 mmol/L — ABNORMAL LOW (ref 3.5–5.1)
Sodium: 139 mmol/L (ref 135–145)
Total Bilirubin: 0.4 mg/dL (ref 0.3–1.2)
Total Protein: 5.9 g/dL — ABNORMAL LOW (ref 6.5–8.1)

## 2020-05-16 LAB — SURGICAL PCR SCREEN
MRSA, PCR: NEGATIVE
Staphylococcus aureus: NEGATIVE

## 2020-05-16 LAB — CBC
HCT: 30.8 % — ABNORMAL LOW (ref 36.0–46.0)
Hemoglobin: 10 g/dL — ABNORMAL LOW (ref 12.0–15.0)
MCH: 30.1 pg (ref 26.0–34.0)
MCHC: 32.5 g/dL (ref 30.0–36.0)
MCV: 92.8 fL (ref 80.0–100.0)
Platelets: 351 10*3/uL (ref 150–400)
RBC: 3.32 MIL/uL — ABNORMAL LOW (ref 3.87–5.11)
RDW: 14.7 % (ref 11.5–15.5)
WBC: 6.8 10*3/uL (ref 4.0–10.5)
nRBC: 0 % (ref 0.0–0.2)

## 2020-05-16 LAB — PREGNANCY, URINE: Preg Test, Ur: NEGATIVE

## 2020-05-16 SURGERY — LAPAROSCOPIC CHOLECYSTECTOMY WITH INTRAOPERATIVE CHOLANGIOGRAM
Anesthesia: General | Site: Abdomen

## 2020-05-16 MED ORDER — DOCUSATE SODIUM 100 MG PO CAPS: 100.0000 mg | ORAL_CAPSULE | Freq: Two times a day (BID) | ORAL | 2 refills | Status: AC

## 2020-05-16 MED ORDER — ACETAMINOPHEN 650 MG RE SUPP: 650.0000 mg | Freq: Four times a day (QID) | RECTAL | Status: DC | PRN

## 2020-05-16 MED ORDER — DIPHENHYDRAMINE HCL 50 MG/ML IJ SOLN
25.0000 mg | Freq: Four times a day (QID) | INTRAMUSCULAR | Status: DC | PRN
  Administered 2020-05-16 (×2): 25 mg via INTRAVENOUS
  Filled 2020-05-16 (×2): qty 1

## 2020-05-16 MED ORDER — CHLORHEXIDINE GLUCONATE 0.12 % MT SOLN
OROMUCOSAL | Status: AC
  Administered 2020-05-16: 15 mL via OROMUCOSAL
  Filled 2020-05-16: qty 15

## 2020-05-16 MED ORDER — LIDOCAINE HCL 1 % IJ SOLN
INTRAMUSCULAR | Status: AC
  Filled 2020-05-16: qty 20

## 2020-05-16 MED ORDER — MIDAZOLAM HCL 2 MG/2ML IJ SOLN
INTRAMUSCULAR | Status: DC | PRN
  Administered 2020-05-16: 2 mg via INTRAVENOUS

## 2020-05-16 MED ORDER — PROPOFOL 10 MG/ML IV BOLUS
INTRAVENOUS | Status: DC | PRN
  Administered 2020-05-16: 160 mg via INTRAVENOUS

## 2020-05-16 MED ORDER — DEXAMETHASONE SODIUM PHOSPHATE 10 MG/ML IJ SOLN
INTRAMUSCULAR | Status: DC | PRN
  Administered 2020-05-16: 10 mg via INTRAVENOUS

## 2020-05-16 MED ORDER — ACETAMINOPHEN 325 MG PO TABS: 650.0000 mg | ORAL_TABLET | Freq: Four times a day (QID) | ORAL | Status: DC | PRN

## 2020-05-16 MED ORDER — SODIUM CHLORIDE 0.9 % IV SOLN
2.0000 g | Freq: Every day | INTRAVENOUS | Status: DC
  Administered 2020-05-16: 2 g via INTRAVENOUS
  Filled 2020-05-16 (×3): qty 20

## 2020-05-16 MED ORDER — ONDANSETRON HCL 4 MG/2ML IJ SOLN
INTRAMUSCULAR | Status: DC | PRN
  Administered 2020-05-16: 4 mg via INTRAVENOUS

## 2020-05-16 MED ORDER — OXYCODONE HCL 5 MG PO TABS: 5.0000 mg | ORAL_TABLET | Freq: Once | ORAL | Status: DC | PRN

## 2020-05-16 MED ORDER — FENTANYL CITRATE (PF) 100 MCG/2ML IJ SOLN
25.0000 ug | INTRAMUSCULAR | Status: DC | PRN
  Administered 2020-05-16 (×4): 25 ug via INTRAVENOUS

## 2020-05-16 MED ORDER — LABETALOL HCL 5 MG/ML IV SOLN
INTRAVENOUS | Status: AC
  Filled 2020-05-16: qty 4

## 2020-05-16 MED ORDER — DIPHENHYDRAMINE HCL 25 MG PO CAPS: 25.0000 mg | ORAL_CAPSULE | Freq: Four times a day (QID) | ORAL | Status: DC | PRN

## 2020-05-16 MED ORDER — HYDRALAZINE HCL 20 MG/ML IJ SOLN
INTRAMUSCULAR | Status: AC
  Filled 2020-05-16: qty 1

## 2020-05-16 MED ORDER — LIDOCAINE HCL 1 % IJ SOLN
INTRAMUSCULAR | Status: DC | PRN
  Administered 2020-05-16: 10 mL

## 2020-05-16 MED ORDER — FENTANYL CITRATE (PF) 250 MCG/5ML IJ SOLN
INTRAMUSCULAR | Status: AC
  Filled 2020-05-16: qty 5

## 2020-05-16 MED ORDER — 0.9 % SODIUM CHLORIDE (POUR BTL) OPTIME
TOPICAL | Status: DC | PRN
  Administered 2020-05-16: 1000 mL

## 2020-05-16 MED ORDER — SUCCINYLCHOLINE CHLORIDE 200 MG/10ML IV SOSY
PREFILLED_SYRINGE | INTRAVENOUS | Status: DC | PRN
  Administered 2020-05-16: 120 mg via INTRAVENOUS

## 2020-05-16 MED ORDER — ROCURONIUM BROMIDE 10 MG/ML (PF) SYRINGE
PREFILLED_SYRINGE | INTRAVENOUS | Status: DC | PRN
  Administered 2020-05-16: 40 mg via INTRAVENOUS
  Administered 2020-05-16: 20 mg via INTRAVENOUS

## 2020-05-16 MED ORDER — PHENYLEPHRINE 40 MCG/ML (10ML) SYRINGE FOR IV PUSH (FOR BLOOD PRESSURE SUPPORT)
PREFILLED_SYRINGE | INTRAVENOUS | Status: AC
  Filled 2020-05-16: qty 10

## 2020-05-16 MED ORDER — BUPIVACAINE-EPINEPHRINE 0.25% -1:200000 IJ SOLN
INTRAMUSCULAR | Status: DC | PRN
  Administered 2020-05-16: 10 mL

## 2020-05-16 MED ORDER — ROCURONIUM BROMIDE 10 MG/ML (PF) SYRINGE
PREFILLED_SYRINGE | INTRAVENOUS | Status: AC
  Filled 2020-05-16: qty 10

## 2020-05-16 MED ORDER — OXYCODONE HCL 5 MG PO TABS
5.0000 mg | ORAL_TABLET | ORAL | Status: DC | PRN
  Administered 2020-05-16 (×2): 10 mg via ORAL
  Filled 2020-05-16 (×4): qty 2

## 2020-05-16 MED ORDER — SODIUM CHLORIDE 0.9 % IR SOLN
Status: DC | PRN
  Administered 2020-05-16: 1000 mL

## 2020-05-16 MED ORDER — LABETALOL HCL 5 MG/ML IV SOLN
10.0000 mg | INTRAVENOUS | Status: AC | PRN
  Administered 2020-05-16 (×2): 10 mg via INTRAVENOUS

## 2020-05-16 MED ORDER — MORPHINE SULFATE (PF) 4 MG/ML IV SOLN
4.0000 mg | INTRAVENOUS | Status: DC | PRN
  Administered 2020-05-16 (×3): 4 mg via INTRAVENOUS
  Filled 2020-05-16 (×3): qty 1

## 2020-05-16 MED ORDER — FENTANYL CITRATE (PF) 100 MCG/2ML IJ SOLN
INTRAMUSCULAR | Status: AC
  Filled 2020-05-16: qty 2

## 2020-05-16 MED ORDER — SIMETHICONE 80 MG PO CHEW: 80.0000 mg | CHEWABLE_TABLET | Freq: Four times a day (QID) | ORAL | 2 refills | Status: AC | PRN

## 2020-05-16 MED ORDER — ONDANSETRON HCL 4 MG/2ML IJ SOLN: 4.0000 mg | Freq: Once | INTRAMUSCULAR | Status: DC | PRN

## 2020-05-16 MED ORDER — SUGAMMADEX SODIUM 200 MG/2ML IV SOLN
INTRAVENOUS | Status: DC | PRN
  Administered 2020-05-16: 100 mg via INTRAVENOUS
  Administered 2020-05-16: 200 mg via INTRAVENOUS

## 2020-05-16 MED ORDER — ONDANSETRON HCL 4 MG/2ML IJ SOLN: 4.0000 mg | Freq: Four times a day (QID) | INTRAMUSCULAR | Status: DC | PRN

## 2020-05-16 MED ORDER — MORPHINE SULFATE (PF) 2 MG/ML IV SOLN: 2.0000 mg | INTRAVENOUS | Status: DC | PRN

## 2020-05-16 MED ORDER — ACETAMINOPHEN 325 MG PO TABS: ORAL_TABLET | ORAL | Status: AC

## 2020-05-16 MED ORDER — OXYCODONE HCL 5 MG/5ML PO SOLN: 5.0000 mg | Freq: Once | ORAL | Status: DC | PRN

## 2020-05-16 MED ORDER — CEFAZOLIN SODIUM 1 G IJ SOLR
INTRAMUSCULAR | Status: AC
  Filled 2020-05-16: qty 20

## 2020-05-16 MED ORDER — POTASSIUM CHLORIDE IN NACL 20-0.9 MEQ/L-% IV SOLN
INTRAVENOUS | Status: DC
  Filled 2020-05-16 (×3): qty 1000

## 2020-05-16 MED ORDER — ORAL CARE MOUTH RINSE: 15.0000 mL | Freq: Once | OROMUCOSAL | Status: AC

## 2020-05-16 MED ORDER — ONDANSETRON 4 MG PO TBDP: 4.0000 mg | ORAL_TABLET | Freq: Four times a day (QID) | ORAL | Status: DC | PRN

## 2020-05-16 MED ORDER — HYDRALAZINE HCL 20 MG/ML IJ SOLN
10.0000 mg | Freq: Once | INTRAMUSCULAR | Status: AC
  Administered 2020-05-16: 10 mg via INTRAVENOUS

## 2020-05-16 MED ORDER — MIDAZOLAM HCL 2 MG/2ML IJ SOLN
INTRAMUSCULAR | Status: AC
  Filled 2020-05-16: qty 2

## 2020-05-16 MED ORDER — FENTANYL CITRATE (PF) 250 MCG/5ML IJ SOLN
INTRAMUSCULAR | Status: DC | PRN
Start: 2020-05-16 — End: 2020-05-16
  Administered 2020-05-16: 50 ug via INTRAVENOUS
  Administered 2020-05-16: 100 ug via INTRAVENOUS
  Administered 2020-05-16: 50 ug via INTRAVENOUS

## 2020-05-16 MED ORDER — IBUPROFEN 800 MG PO TABS: 800.0000 mg | ORAL_TABLET | Freq: Three times a day (TID) | ORAL | 0 refills | Status: AC | PRN

## 2020-05-16 MED ORDER — LACTATED RINGERS IV SOLN: INTRAVENOUS | Status: DC

## 2020-05-16 MED ORDER — OXYCODONE HCL 5 MG PO TABS
ORAL_TABLET | ORAL | 0 refills | Status: AC
Start: 2020-05-16 — End: ?

## 2020-05-16 MED ORDER — LIDOCAINE 2% (20 MG/ML) 5 ML SYRINGE
INTRAMUSCULAR | Status: DC | PRN
  Administered 2020-05-16: 40 mg via INTRAVENOUS

## 2020-05-16 MED ORDER — CHLORHEXIDINE GLUCONATE 0.12 % MT SOLN: 15.0000 mL | Freq: Once | OROMUCOSAL | Status: AC

## 2020-05-16 MED ORDER — BUPIVACAINE-EPINEPHRINE (PF) 0.25% -1:200000 IJ SOLN
INTRAMUSCULAR | Status: AC
  Filled 2020-05-16: qty 30

## 2020-05-16 SURGICAL SUPPLY — 32 items
APPLIER CLIP 5 13 M/L LIGAMAX5 (MISCELLANEOUS) ×2
CANISTER SUCT 3000ML PPV (MISCELLANEOUS) ×2 IMPLANT
CHLORAPREP W/TINT 26 (MISCELLANEOUS) ×2 IMPLANT
CLIP APPLIE 5 13 M/L LIGAMAX5 (MISCELLANEOUS) ×1 IMPLANT
COVER SURGICAL LIGHT HANDLE (MISCELLANEOUS) ×2 IMPLANT
DERMABOND ADVANCED (GAUZE/BANDAGES/DRESSINGS) ×1
DERMABOND ADVANCED .7 DNX12 (GAUZE/BANDAGES/DRESSINGS) ×1 IMPLANT
DISSECTOR BLUNT TIP ENDO 5MM (MISCELLANEOUS) ×2 IMPLANT
ELECT REM PT RETURN 9FT ADLT (ELECTROSURGICAL) ×2
ELECTRODE REM PT RTRN 9FT ADLT (ELECTROSURGICAL) ×1 IMPLANT
GLOVE BIO SURGEON STRL SZ 6.5 (GLOVE) ×2 IMPLANT
GLOVE BIOGEL PI IND STRL 6 (GLOVE) ×1 IMPLANT
GLOVE BIOGEL PI INDICATOR 6 (GLOVE) ×1
GOWN STRL REUS W/ TWL LRG LVL3 (GOWN DISPOSABLE) ×3 IMPLANT
GOWN STRL REUS W/TWL LRG LVL3 (GOWN DISPOSABLE) ×3
KIT BASIN OR (CUSTOM PROCEDURE TRAY) ×2 IMPLANT
KIT TURNOVER KIT B (KITS) ×2 IMPLANT
NS IRRIG 1000ML POUR BTL (IV SOLUTION) ×2 IMPLANT
PAD ARMBOARD 7.5X6 YLW CONV (MISCELLANEOUS) ×2 IMPLANT
POUCH SPECIMEN RETRIEVAL 10MM (ENDOMECHANICALS) ×2 IMPLANT
SCISSORS LAP 5X35 DISP (ENDOMECHANICALS) ×2 IMPLANT
SET IRRIG TUBING LAPAROSCOPIC (IRRIGATION / IRRIGATOR) ×2 IMPLANT
SET TUBE SMOKE EVAC HIGH FLOW (TUBING) ×2 IMPLANT
SLEEVE ENDOPATH XCEL 5M (ENDOMECHANICALS) ×4 IMPLANT
SPECIMEN JAR SMALL (MISCELLANEOUS) ×2 IMPLANT
SUT MNCRL AB 4-0 PS2 18 (SUTURE) ×2 IMPLANT
TOWEL GREEN STERILE (TOWEL DISPOSABLE) ×2 IMPLANT
TOWEL GREEN STERILE FF (TOWEL DISPOSABLE) ×2 IMPLANT
TRAY LAPAROSCOPIC MC (CUSTOM PROCEDURE TRAY) ×2 IMPLANT
TROCAR XCEL BLUNT TIP 100MML (ENDOMECHANICALS) ×2 IMPLANT
TROCAR XCEL NON-BLD 5MMX100MML (ENDOMECHANICALS) ×2 IMPLANT
WATER STERILE IRR 1000ML POUR (IV SOLUTION) ×2 IMPLANT

## 2020-05-16 NOTE — Progress Notes (Signed)
General Surgery Follow Up Note  Subjective:    Overnight Issues:   Objective:  Vital signs for last 24 hours: Temp:  [97.8 F (36.6 C)-98.8 F (37.1 C)] 98.3 F (36.8 C) (10/10 0449) Pulse Rate:  [69-105] 72 (10/10 0449) Resp:  [17-19] 17 (10/10 0449) BP: (114-184)/(96-133) 155/107 (10/10 0449) SpO2:  [98 %-100 %] 98 % (10/10 0449)  Hemodynamic parameters for last 24 hours:    Intake/Output from previous day: No intake/output data recorded.  Intake/Output this shift: No intake/output data recorded.  Vent settings for last 24 hours:    Physical Exam:  Gen: comfortable, no distress Neuro: non-focal exam HEENT: PERRL Neck: supple CV: RRR Pulm: unlabored breathing Abd: soft, RUQ TTP Extr: wwp, no edema   Results for orders placed or performed during the hospital encounter of 05/15/20 (from the past 24 hour(s))  CBC with Differential/Platelet     Status: Abnormal   Collection Time: 05/15/20  2:41 PM  Result Value Ref Range   WBC 7.1 4.0 - 10.5 K/uL   RBC 3.56 (L) 3.87 - 5.11 MIL/uL   Hemoglobin 10.7 (L) 12.0 - 15.0 g/dL   HCT 24.5 (L) 36 - 46 %   MCV 94.7 80.0 - 100.0 fL   MCH 30.1 26.0 - 34.0 pg   MCHC 31.8 30.0 - 36.0 g/dL   RDW 80.9 98.3 - 38.2 %   Platelets 386 150 - 400 K/uL   nRBC 0.0 0.0 - 0.2 %   Neutrophils Relative % 74 %   Neutro Abs 5.2 1.7 - 7.7 K/uL   Lymphocytes Relative 18 %   Lymphs Abs 1.3 0.7 - 4.0 K/uL   Monocytes Relative 7 %   Monocytes Absolute 0.5 0.1 - 1.0 K/uL   Eosinophils Relative 1 %   Eosinophils Absolute 0.1 0 - 0 K/uL   Basophils Relative 0 %   Basophils Absolute 0.0 0 - 0 K/uL   Immature Granulocytes 0 %   Abs Immature Granulocytes 0.01 0.00 - 0.07 K/uL  Comprehensive metabolic panel     Status: Abnormal   Collection Time: 05/15/20  2:41 PM  Result Value Ref Range   Sodium 145 135 - 145 mmol/L   Potassium 3.8 3.5 - 5.1 mmol/L   Chloride 113 (H) 98 - 111 mmol/L   CO2 23 22 - 32 mmol/L   Glucose, Bld 85 70 - 99  mg/dL   BUN 8 6 - 20 mg/dL   Creatinine, Ser 5.05 0.44 - 1.00 mg/dL   Calcium 8.7 (L) 8.9 - 10.3 mg/dL   Total Protein 6.3 (L) 6.5 - 8.1 g/dL   Albumin 2.9 (L) 3.5 - 5.0 g/dL   AST 14 (L) 15 - 41 U/L   ALT 10 0 - 44 U/L   Alkaline Phosphatase 59 38 - 126 U/L   Total Bilirubin 0.3 0.3 - 1.2 mg/dL   GFR, Estimated >39 >76 mL/min   Anion gap 9 5 - 15  Protein / creatinine ratio, urine     Status: None   Collection Time: 05/15/20  2:41 PM  Result Value Ref Range   Creatinine, Urine 542.12 mg/dL   Total Protein, Urine 55 mg/dL   Protein Creatinine Ratio 0.10 0.00 - 0.15 mg/mg[Cre]  Lipase, blood     Status: None   Collection Time: 05/15/20  2:41 PM  Result Value Ref Range   Lipase 33 11 - 51 U/L  Respiratory Panel by RT PCR (Flu A&B, Covid) - Nasopharyngeal Swab  Status: None   Collection Time: 05/15/20  9:50 PM   Specimen: Nasopharyngeal Swab  Result Value Ref Range   SARS Coronavirus 2 by RT PCR NEGATIVE NEGATIVE   Influenza A by PCR NEGATIVE NEGATIVE   Influenza B by PCR NEGATIVE NEGATIVE  Comprehensive metabolic panel     Status: Abnormal   Collection Time: 05/16/20  4:20 AM  Result Value Ref Range   Sodium 139 135 - 145 mmol/L   Potassium 3.3 (L) 3.5 - 5.1 mmol/L   Chloride 109 98 - 111 mmol/L   CO2 20 (L) 22 - 32 mmol/L   Glucose, Bld 88 70 - 99 mg/dL   BUN <5 (L) 6 - 20 mg/dL   Creatinine, Ser 4.01 0.44 - 1.00 mg/dL   Calcium 8.3 (L) 8.9 - 10.3 mg/dL   Total Protein 5.9 (L) 6.5 - 8.1 g/dL   Albumin 2.7 (L) 3.5 - 5.0 g/dL   AST 13 (L) 15 - 41 U/L   ALT 10 0 - 44 U/L   Alkaline Phosphatase 47 38 - 126 U/L   Total Bilirubin 0.4 0.3 - 1.2 mg/dL   GFR, Estimated >02 >72 mL/min   Anion gap 10 5 - 15  CBC     Status: Abnormal   Collection Time: 05/16/20  4:20 AM  Result Value Ref Range   WBC 6.8 4.0 - 10.5 K/uL   RBC 3.32 (L) 3.87 - 5.11 MIL/uL   Hemoglobin 10.0 (L) 12.0 - 15.0 g/dL   HCT 53.6 (L) 36 - 46 %   MCV 92.8 80.0 - 100.0 fL   MCH 30.1 26.0 - 34.0 pg    MCHC 32.5 30.0 - 36.0 g/dL   RDW 64.4 03.4 - 74.2 %   Platelets 351 150 - 400 K/uL   nRBC 0.0 0.0 - 0.2 %  Pregnancy, urine     Status: None   Collection Time: 05/16/20  7:42 AM  Result Value Ref Range   Preg Test, Ur NEGATIVE NEGATIVE    Assessment & Plan:  Present on Admission: . Cholecystitis    LOS: 0 days   Additional comments:I reviewed the patient's new clinical lab test results.   and I reviewed the patients new imaging test results.    Acute cholecystitis - lap chole today,  FEN - diet post-op DVT - SCDs, LMWH post-op Dispo - floor, possibly home this PM  Diamantina Monks, MD Trauma & General Surgery Please use [email or web address] to contact on call provider  05/16/2020  *Care during the described time interval was provided by me. I have reviewed this patient's available data, including medical history, events of note, physical examination and test results as part of my evaluation.

## 2020-05-16 NOTE — Anesthesia Procedure Notes (Signed)
Procedure Name: Intubation Date/Time: 05/16/2020 10:12 AM Performed by: Bryson Corona, CRNA Pre-anesthesia Checklist: Patient identified, Emergency Drugs available, Suction available and Patient being monitored Patient Re-evaluated:Patient Re-evaluated prior to induction Oxygen Delivery Method: Circle System Utilized Preoxygenation: Pre-oxygenation with 100% oxygen Induction Type: IV induction, Rapid sequence and Cricoid Pressure applied Laryngoscope Size: Mac and 3 Grade View: Grade II Tube type: Oral Tube size: 7.0 mm Number of attempts: 1 Airway Equipment and Method: Stylet Placement Confirmation: ETT inserted through vocal cords under direct vision,  positive ETCO2 and breath sounds checked- equal and bilateral Secured at: 20 cm Tube secured with: Tape Dental Injury: Teeth and Oropharynx as per pre-operative assessment  Difficulty Due To: Difficulty was anticipated and Difficult Airway-  due to edematous airway Comments: Pt status post c-section 3 weeks ago. RSI with cricoid pressure. Pt has lots of extra periorbital tissue/swelling. Grade 2b view with MAC 3.

## 2020-05-16 NOTE — Discharge Instructions (Signed)
May shower beginning 05/17/2020. Do not peel off or scrub skin glue. May allow warm soapy water to run over incision, then rinse and pat dry. Do not soak in any water (tubs, hot tubs, pools, lakes, oceans) for one week.   No lifting greater than 5 pounds for six weeks.   Call the office at [phone] for temperature greater than 101.5F, worsening pain, redness or warmth at the incision site.  Please call [phone] to make an appointment for 2 weeks after surgery for wound check.      [facility] ______[facility] LAPAROSCOPIC SURGERY: POST OP INSTRUCTIONS Always review your discharge instruction sheet given to you by the facility where your surgery was performed. IF YOU HAVE DISABILITY OR FAMILY LEAVE FORMS, YOU MUST BRING THEM TO THE OFFICE FOR PROCESSING.   DO NOT GIVE THEM TO YOUR DOCTOR.  1. A prescription for pain medication may be given to you upon discharge.  Take your pain medication as prescribed, if needed.  If narcotic pain medicine is not needed, then you may take acetaminophen (Tylenol) or ibuprofen (Advil) as needed. 2. Take your usually prescribed medications unless otherwise directed. 3. If you need a refill on your pain medication, please contact your pharmacy.  They will contact our office to request authorization. Prescriptions will not be filled after 5pm or on week-ends. 4. You should follow a light diet the first few days after arrival home, such as soup and crackers, etc.  Be sure to include lots of fluids daily. 5. Most patients will experience some swelling and bruising in the area of the incisions.  Ice packs will help.  Swelling and bruising can take several days to resolve.  6. It is common to experience some constipation if taking pain medication after surgery.  Increasing fluid intake and taking a stool softener (such as Colace) will usually help or prevent this problem from occurring.  A mild laxative (Milk of Magnesia or Miralax) should be taken  according to package instructions if there are no bowel movements after 48 hours. 7. Unless discharge instructions indicate otherwise, you may remove your bandages 24-48 hours after surgery, and you may shower at that time.  You may have steri-strips (small skin tapes) in place directly over the incision.  These strips should be left on the skin for 7-10 days.  If your surgeon used skin glue on the incision, you may shower in 24 hours.  The glue will flake off over the next 2-3 weeks.  Any sutures or staples will be removed at the office during your follow-up visit. 8. ACTIVITIES:  You may resume regular (light) daily activities beginning the next day--such as daily self-care, walking, climbing stairs--gradually increasing activities as tolerated.  You may have sexual intercourse when it is comfortable.  Refrain from any heavy lifting or straining until approved by your doctor. a. You may drive when you are no longer taking prescription pain medication, you can comfortably wear a seatbelt, and you can safely maneuver your car and apply brakes. b. RETURN TO WORK:  __________________________________________________________ 9. You should see your doctor in the office for a follow-up appointment approximately 2-3 weeks after your surgery.  Make sure that you call for this appointment within a day or two after you arrive home to insure a convenient appointment time. 10. OTHER INSTRUCTIONS: __________________________________________________________________________________________________________________________ __________________________________________________________________________________________________________________________ WHEN TO CALL YOUR DOCTOR: 1. Fever over 101.0 2. Inability to urinate 3. Continued bleeding from incision. 4. Increased pain, redness, or drainage from the incision. 5.   Increasing abdominal pain  The clinic staff is available to answer your questions during regular business hours.   Please don't hesitate to call and ask to speak to one of the nurses for clinical concerns.  If you have a medical emergency, go to the nearest emergency room or call 911.  A surgeon from [facility] is always on call at the hospital. [address], [address], [place], [place]  [address] ? [address] [address], [place], [place]   [address] [phone] ? [phone] ? FAX [phone] Web site: [email or web address] 

## 2020-05-16 NOTE — Anesthesia Preprocedure Evaluation (Signed)
Anesthesia Evaluation  Patient identified by MRN, date of birth, ID band Patient awake    Reviewed: Allergy & Precautions, NPO status , Patient's Chart, lab work & pertinent test results  Airway Mallampati: II  TM Distance: >3 FB Neck ROM: Full    Dental  (+) Teeth Intact, Dental Advisory Given   Pulmonary    breath sounds clear to auscultation       Cardiovascular hypertension,  Rhythm:Regular Rate:Normal     Neuro/Psych    GI/Hepatic   Endo/Other    Renal/GU      Musculoskeletal   Abdominal   Peds  Hematology   Anesthesia Other Findings   Reproductive/Obstetrics                             Anesthesia Physical Anesthesia Plan  ASA: II  Anesthesia Plan: General   Post-op Pain Management:    Induction: Intravenous, Cricoid pressure planned and Rapid sequence  PONV Risk Score and Plan:   Airway Management Planned: Oral ETT  Additional Equipment:   Intra-op Plan:   Post-operative Plan: Extubation in OR  Informed Consent: I have reviewed the patients History and Physical, chart, labs and discussed the procedure including the risks, benefits and alternatives for the proposed anesthesia with the patient or authorized representative who has indicated his/her understanding and acceptance.     Dental advisory given  Plan Discussed with: CRNA and Anesthesiologist  Anesthesia Plan Comments:         Anesthesia Quick Evaluation

## 2020-05-16 NOTE — Op Note (Signed)
   Operative Note  Date: 05/16/2020  Procedure: laparoscopic cholecystectomy  Pre-op diagnosis: acute cholecystitis Post-op diagnosis: Acute calculous cholecystitis  Indication and clinical history: The patient is a 34 y.o. year old female with acute cholecystitis, 3 week post-op from c-section  Surgeon: Diamantina Monks, MD Assistant: Erven Colla, MD, PGY5  Anesthesiologist: Joslin Anesthesia: General  Findings:  . Specimen: gallbladder . EBL: 5cc . Drains/Implants: none  Disposition: PACU - hemodynamically stable.  Description of procedure: The patient was positioned supine on the operating room table. Time-out was performed verifying correct patient, procedure, signature of informed consent, and administration of pre-operative antibiotics. General anesthetic induction and intubation were uneventful. The abdomen was prepped and draped in the usual sterile fashion. An infra-umbilical incision was made using an open technique using zero vicryl stay sutures on either side of the fascia and a 67mm Hassan port inserted. After establishing pneumoperitoneum, which the patient tolerated well, the abdominal cavity was inspected and no injury of any intra-abdominal structures was identified. Additional ports were placed under direct visualization and using local anesthetic: two 47mm ports in the right subcostal region and a 73mm port in the epigastric region. The patient was re-positioned to reverse Trendelenburg and right side up. Adhesiolysis was performed to expose the gallbladder, which was then retracted cephalad. The infundibulum was identified and retracted toward the right lower quadrant. The peritoneum was incised over the infundibulum and the triangle of Calot dissected to expose the critical view of safety. With clear identification and isolation of the cystic duct and cystic artery, the cystic duct and artery were each doubly clipped and divided. The gallbladder was dissected off the liver  bed using electrocautery and hemostasis of the liver bed was confirmed prior to separation of the final peritoneal attachments of the gallbladder to the liver bed. The gallbladder fossa was irrigated and fluid returned clear. After transection of the final peritoneal attachments, the gallbladder was placed in an endoscopic specimen retrieval bag, removed via the umbilical port site, and sent to pathology as a specimen. The gallbladder fossa was inspected confirming hemostasis, the absence of bile leakage from the cystic duct stump, and correct placement of clips on the cystic artery and cystic duct stumps. The abdomen was desufflated and the fascia of the umbilical port site was closed using the previously placed stay sutures. Additional local anesthetic was administered at the umbilical port site.  The skin of all incisions was closed with 4-0 monocryl. Sterile dressings were applied. All sponge and instrument counts were correct at the conclusion of the procedure. The patient was awakened from anesthesia, extubated uneventfully, and transported to the PACU - hemodynamically stable.. There were no complications.    Diamantina Monks, MD General and Trauma Surgery [facility]

## 2020-05-16 NOTE — Anesthesia Postprocedure Evaluation (Signed)
Anesthesia Post Note  Patient: Leah Underwood  Procedure(s) Performed: LAPAROSCOPIC CHOLECYSTECTOMY (N/A Abdomen)     Patient location during evaluation: PACU Anesthesia Type: General Level of consciousness: awake and alert Pain management: pain level controlled Vital Signs Assessment: post-procedure vital signs reviewed and stable Respiratory status: spontaneous breathing, nonlabored ventilation, respiratory function stable and patient connected to nasal cannula oxygen Cardiovascular status: blood pressure returned to baseline and stable Postop Assessment: no apparent nausea or vomiting Anesthetic complications: no   No complications documented.  Last Vitals:  Vitals:   05/16/20 1245 05/16/20 1300  BP: (!) 157/110 (!) 148/112  Pulse: 72 73  Resp: 13 11  Temp:    SpO2: 100% 100%    Last Pain:  Vitals:   05/16/20 1300  TempSrc:   PainSc: 5                  Pascal Stiggers COKER

## 2020-05-16 NOTE — Transfer of Care (Signed)
Immediate Anesthesia Transfer of Care Note  Patient: Kyliana Standen  Procedure(s) Performed: LAPAROSCOPIC CHOLECYSTECTOMY (N/A Abdomen)  Patient Location: PACU  Anesthesia Type:General  Level of Consciousness: awake and alert   Airway & Oxygen Therapy: Patient Spontanous Breathing and Patient connected to face mask oxygen  Post-op Assessment: Report given to RN and Post -op Vital signs reviewed and stable  Post vital signs: Reviewed and stable  Last Vitals:  Vitals Value Taken Time  BP 185/111 05/16/20 1146  Temp    Pulse 80 05/16/20 1146  Resp 18 05/16/20 1146  SpO2 100 % 05/16/20 1146  Vitals shown include unvalidated device data.  Last Pain:  Vitals:   05/16/20 0743  TempSrc:   PainSc: Asleep      Patients Stated Pain Goal: 0 (05/15/20 1906)  Complications: No complications documented.

## 2020-05-17 ENCOUNTER — Encounter (HOSPITAL_COMMUNITY): Payer: Self-pay | Admitting: Surgery

## 2020-05-17 NOTE — Discharge Summary (Signed)
Physician Discharge Summary  Patient ID: Leah Underwood MRN: [id number] DOB/AGE: 10/27/1985 34 y.o.  Admit date: 05/15/2020 Discharge date: 05/17/2020  Admission Diagnoses:  Acute cholecystitis S/p C-section 04/26/2020 Hx preeclampsia Hx tobacco use Hx asthma/bronchitis  Discharge Diagnoses:  Same  Active Problems:   Cholecystitis   PROCEDURES: Laparoscopic cholecystectomy 06/16/2020, Dr. Aliene Beams Course:   34yo F status post cesarean section on 9/20 presents to the MAU complaining of right upper quadrant and epigastric pain.  She has had 2 other similar episodes since her baby was born.  Today, the pain returned and was quite severe.  The pain is in her right upper quadrant and radiates to her epigastrium.  She underwent evaluation in the MAU.  Liver function tests are unremarkable.  White count is normal.  Ultrasound is consistent with acute cholecystitis and I was asked to see her for admission.  She continues to have pain.  She has been treated for hypertension in the MAU.  She takes Procardia at home but has been taking a lower dose than prescribed.  Patient was seen in the emergency department and admitted by Dr. Violeta Gelinas.  Patient was seen the following a.m. by Dr. Kris Mouton and taken the operating room for laparoscopic cholecystectomy.  She was found to have acute calculus cholecystitis.  She underwent laparoscopic cholecystectomy by Dr. Bedelia Person.  She did well postop and was discharged home from the PACU.  CBC Latest Ref Rng & Units 05/16/2020 05/15/2020 04/29/2020  WBC 4.0 - 10.5 K/uL 6.8 7.1 13.5(H)  Hemoglobin 12.0 - 15.0 g/dL 10.0(L) 10.7(L) 7.7(L)  Hematocrit 36 - 46 % 30.8(L) 33.7(L) 23.1(L)  Platelets 150 - 400 K/uL 351 386 161    CMP Latest Ref Rng & Units 05/16/2020 05/15/2020 04/28/2020  Glucose 70 - 99 mg/dL 88 85 94  BUN 6 - 20 mg/dL <4(U) 8 7  Creatinine 9.81 - 1.00 mg/dL 1.91 4.78 2.95  Sodium 135 - 145 mmol/L 139 145 134(L)  Potassium  3.5 - 5.1 mmol/L 3.3(L) 3.8 4.1  Chloride 98 - 111 mmol/L 109 113(H) 106  CO2 22 - 32 mmol/L 20(L) 23 21(L)  Calcium 8.9 - 10.3 mg/dL 8.3(L) 8.7(L) 7.3(L)  Total Protein 6.5 - 8.1 g/dL 5.9(L) 6.3(L) -  Total Bilirubin 0.3 - 1.2 mg/dL 0.4 0.3 -  Alkaline Phos 38 - 126 U/L 47 59 -  AST 15 - 41 U/L 13(L) 14(L) -  ALT 0 - 44 U/L 10 10 -    I did not see the patient.  Discharge summary is from the hospital notes.  Condition on discharge: Improved.  Disposition: Discharge disposition: 01-Home or Self Care       Discharge Instructions    Diet - low sodium heart healthy   Complete by: As directed    Increase activity slowly   Complete by: As directed    No wound care   Complete by: As directed      Allergies as of 05/16/2020      Reactions   Peach [prunus Persica] Anaphylaxis   Peanut-containing Drug Products Anaphylaxis   Tomato Anaphylaxis      Medication List    TAKE these medications   acetaminophen 325 MG tablet Commonly known as: TYLENOL You can take 2 tablets every 4 hours as needed for pain. You can alternate with the ibuprofen from home.  Do not take more than 4000 mg of Tylenol/acetaminophen per day, it can harm your liver.  You can buy this over the counter  at any drug store.   docusate sodium 100 MG capsule Commonly known as: Colace Take 1 capsule (100 mg total) by mouth 2 (two) times daily.   ferrous sulfate 325 (65 FE) MG tablet Commonly known as: FerrouSul Take 1 tablet (325 mg total) by mouth 2 (two) times daily.   ibuprofen 600 MG tablet Commonly known as: ADVIL Take 1 tablet (600 mg total) by mouth every 6 (six) hours as needed for headache, mild pain, moderate pain or cramping. What changed: Another medication with the same name was added. Make sure you understand how and when to take each.   ibuprofen 800 MG tablet Commonly known as: ADVIL Take 1 tablet (800 mg total) by mouth every 8 (eight) hours as needed. What changed: You were already  taking a medication with the same name, and this prescription was added. Make sure you understand how and when to take each.   multivitamin-prenatal 27-0.8 MG Tabs tablet Take 1 tablet by mouth daily at 12 noon.   NIFEdipine 30 MG 24 hr tablet Commonly known as: PROCARDIA-XL/NIFEDICAL-XL Take 2 tablets (60 mg total) by mouth daily. Can increase to twice a day as needed for symptomatic contractions   oxyCODONE 5 MG immediate release tablet Commonly known as: Oxy IR/ROXICODONE Take one tablet every 6 hours as needed for pain not relieved by Tylenol/acetaminophen, and ibuprofen. What changed:   how much to take  how to take this  when to take this  reasons to take this  additional instructions   simethicone 80 MG chewable tablet Commonly known as: Gas-X Chew 1 tablet (80 mg total) by mouth 4 (four) times daily as needed for flatulence.   zolpidem 5 MG tablet Commonly known as: AMBIEN Take 1 tablet (5 mg total) by mouth at bedtime as needed for sleep.       Follow-up Information    [facility] Follow up.   Specialty: General Surgery Why: The office should call you with a follow up appointment.  If you do not hear from our office Monday, call Tuesday. Contact information: [address] [place] [address] [phone]               Signed: Sherrie George 05/17/2020, 2:59 PM

## 2020-05-18 LAB — SURGICAL PATHOLOGY

## 2020-05-31 ENCOUNTER — Ambulatory Visit: Payer: Medicaid Other | Admitting: Obstetrics and Gynecology

## 2021-03-16 ENCOUNTER — Emergency Department (HOSPITAL_COMMUNITY)
Admission: EM | Admit: 2021-03-16 | Discharge: 2021-03-16 | Discharge status: Against Medical Advice | Payer: Medicaid Other | Attending: Emergency Medicine | Admitting: Emergency Medicine

## 2021-03-16 ENCOUNTER — Encounter (HOSPITAL_COMMUNITY): Payer: Self-pay | Admitting: Emergency Medicine

## 2021-03-16 ENCOUNTER — Other Ambulatory Visit: Payer: Self-pay

## 2021-03-16 DIAGNOSIS — Z20822 Contact with and (suspected) exposure to covid-19: Secondary | ICD-10-CM | POA: Diagnosis not present

## 2021-03-16 DIAGNOSIS — R109 Unspecified abdominal pain: Secondary | ICD-10-CM | POA: Diagnosis present

## 2021-03-16 DIAGNOSIS — I10 Essential (primary) hypertension: Secondary | ICD-10-CM | POA: Diagnosis not present

## 2021-03-16 DIAGNOSIS — R1030 Lower abdominal pain, unspecified: Secondary | ICD-10-CM | POA: Insufficient documentation

## 2021-03-16 DIAGNOSIS — F17200 Nicotine dependence, unspecified, uncomplicated: Secondary | ICD-10-CM | POA: Insufficient documentation

## 2021-03-16 DIAGNOSIS — J45909 Unspecified asthma, uncomplicated: Secondary | ICD-10-CM | POA: Insufficient documentation

## 2021-03-16 LAB — RESP PANEL BY RT-PCR (FLU A&B, COVID) ARPGX2
Influenza A by PCR: NEGATIVE
Influenza B by PCR: NEGATIVE
SARS Coronavirus 2 by RT PCR: NEGATIVE

## 2021-03-16 LAB — CBC WITH DIFFERENTIAL/PLATELET
Abs Immature Granulocytes: 0 10*3/uL (ref 0.00–0.07)
Basophils Absolute: 0 10*3/uL (ref 0.0–0.1)
Basophils Relative: 0 %
Eosinophils Absolute: 0.1 10*3/uL (ref 0.0–0.5)
Eosinophils Relative: 3 %
HCT: 38.1 % (ref 36.0–46.0)
Hemoglobin: 12.3 g/dL (ref 12.0–15.0)
Immature Granulocytes: 0 %
Lymphocytes Relative: 36 %
Lymphs Abs: 1 10*3/uL (ref 0.7–4.0)
MCH: 27.8 pg (ref 26.0–34.0)
MCHC: 32.3 g/dL (ref 30.0–36.0)
MCV: 86.2 fL (ref 80.0–100.0)
Monocytes Absolute: 0.5 10*3/uL (ref 0.1–1.0)
Monocytes Relative: 16 %
Neutro Abs: 1.3 10*3/uL — ABNORMAL LOW (ref 1.7–7.7)
Neutrophils Relative %: 45 %
Platelets: 267 10*3/uL (ref 150–400)
RBC: 4.42 MIL/uL (ref 3.87–5.11)
RDW: 14.9 % (ref 11.5–15.5)
WBC: 2.8 10*3/uL — ABNORMAL LOW (ref 4.0–10.5)
nRBC: 0 % (ref 0.0–0.2)

## 2021-03-16 LAB — URINALYSIS, ROUTINE W REFLEX MICROSCOPIC
Bilirubin Urine: NEGATIVE
Glucose, UA: NEGATIVE mg/dL
Ketones, ur: NEGATIVE mg/dL
Leukocytes,Ua: NEGATIVE
Nitrite: NEGATIVE
Protein, ur: 30 mg/dL — AB
Specific Gravity, Urine: 1.03 (ref 1.005–1.030)
pH: 5 (ref 5.0–8.0)

## 2021-03-16 LAB — COMPREHENSIVE METABOLIC PANEL
ALT: 18 U/L (ref 0–44)
AST: 25 U/L (ref 15–41)
Albumin: 3.6 g/dL (ref 3.5–5.0)
Alkaline Phosphatase: 55 U/L (ref 38–126)
Anion gap: 4 — ABNORMAL LOW (ref 5–15)
BUN: 10 mg/dL (ref 6–20)
CO2: 26 mmol/L (ref 22–32)
Calcium: 8.2 mg/dL — ABNORMAL LOW (ref 8.9–10.3)
Chloride: 105 mmol/L (ref 98–111)
Creatinine, Ser: 0.78 mg/dL (ref 0.44–1.00)
GFR, Estimated: >60 mL/min (ref >60)
Glucose, Bld: 84 mg/dL (ref 70–99)
Potassium: 3.6 mmol/L (ref 3.5–5.1)
Sodium: 135 mmol/L (ref 135–145)
Total Bilirubin: 0.6 mg/dL (ref 0.3–1.2)
Total Protein: 7.1 g/dL (ref 6.5–8.1)

## 2021-03-16 LAB — LIPASE, BLOOD: Lipase: 28 U/L (ref 11–51)

## 2021-03-16 MED ORDER — KETOROLAC TROMETHAMINE 30 MG/ML IJ SOLN
30.0000 mg | Freq: Once | INTRAMUSCULAR | Status: AC
  Administered 2021-03-16: 30 mg via INTRAVENOUS
  Filled 2021-03-16: qty 1

## 2021-03-16 MED ORDER — METOCLOPRAMIDE HCL 5 MG/ML IJ SOLN
10.0000 mg | Freq: Once | INTRAMUSCULAR | Status: AC
  Administered 2021-03-16: 10 mg via INTRAVENOUS
  Filled 2021-03-16: qty 2

## 2021-03-16 MED ORDER — DIPHENHYDRAMINE HCL 50 MG/ML IJ SOLN
25.0000 mg | Freq: Once | INTRAMUSCULAR | Status: AC
  Administered 2021-03-16: 25 mg via INTRAVENOUS
  Filled 2021-03-16: qty 1

## 2021-03-16 MED ORDER — ONDANSETRON HCL 4 MG/2ML IJ SOLN
4.0000 mg | Freq: Once | INTRAMUSCULAR | Status: AC
  Administered 2021-03-16: 4 mg via INTRAVENOUS
  Filled 2021-03-16: qty 2

## 2021-03-16 MED ORDER — SODIUM CHLORIDE 0.9 % IV BOLUS
1000.0000 mL | Freq: Once | INTRAVENOUS | Status: AC
  Administered 2021-03-16: 1000 mL via INTRAVENOUS

## 2021-03-16 NOTE — ED Notes (Signed)
Pt left ama, refused final vitals

## 2021-03-16 NOTE — ED Provider Notes (Signed)
[facility] EMERGENCY DEPARTMENT Provider Note   CSN: [id number] Arrival date & time: 03/16/21  1244     History Chief Complaint  Patient presents with   Abdominal Pain    Leah Underwood is a 35 y.o. female.   Abdominal Pain Associated symptoms: cough, diarrhea, nausea and vomiting   Associated symptoms: no chest pain, no fever, no vaginal bleeding and no vaginal discharge       Leah Underwood is a 35 y.o. female who presents to the Emergency Department complaining of  lower abdominal pain, nausea, vomiting and diarrhea. Symptoms present for 3 days.  She also complains of frontal headache, body aches and cough.  Lower abdominal pain described as crampy pain along area of prior C section from 2021.  States she is tolerating small amounts of fluids today.  Headache described as gradual in onset with sensitivity to light.  She denies fever, neck pain or stiffness, chest pain or shortness of breath.  No known recent covid exposures.     Past Medical History:  Diagnosis Date   Asthma    Bronchitis    Pre-eclampsia 2021   Occurred during both pregnancies   Smoker     Patient Active Problem List   Diagnosis Date Noted   Cholecystitis 05/15/2020   Acute blood loss anemia 04/27/2020   Chronic hypertension 04/27/2020   Chronic hypertension with superimposed pre-eclampsia 04/27/2020   H/O: C-section 04/27/2020   Hypokalemia 04/27/2020   Hypocalcemia 04/27/2020   Antepartum placental abruption 04/26/2020    Past Surgical History:  Procedure Laterality Date   CESAREAN SECTION  2021   Both pregnancies   CESAREAN SECTION N/A 04/26/2020   Procedure: CESAREAN SECTION;  Surgeon: Conan Bowens, MD;  Location: [facility] LD ORS;  Service: Obstetrics;  Laterality: N/A;   CHOLECYSTECTOMY N/A 05/16/2020   Procedure: LAPAROSCOPIC CHOLECYSTECTOMY;  Surgeon: Diamantina Monks, MD;  Location: [facility] OR;  Service: General;  Laterality: N/A;     OB History     Gravida  2   Para  2   Term  1    Preterm  1   AB      Living  1      SAB      IAB      Ectopic      Multiple  0   Live Births  1           History reviewed. No pertinent family history.  Social History   Tobacco Use   Smoking status: Smoker, Current Status Unknown   Smokeless tobacco: Never  Vaping Use   Vaping Use: Never used  Substance Use Topics   Alcohol use: Not Currently   Drug use: Yes    Types: Marijuana    Home Medications Prior to Admission medications   Medication Sig Start Date End Date Taking? Authorizing Provider  acetaminophen (TYLENOL) 325 MG tablet You can take 2 tablets every 4 hours as needed for pain. You can alternate with the ibuprofen from home.  Do not take more than 4000 mg of Tylenol/acetaminophen per day, it can harm your liver.  You can buy this over the counter at any drug store. 05/16/20   Sherrie George, PA-C  docusate sodium (COLACE) 100 MG capsule Take 1 capsule (100 mg total) by mouth 2 (two) times daily. 05/16/20 05/16/21  Diamantina Monks, MD  ferrous sulfate (FERROUSUL) 325 (65 FE) MG tablet Take 1 tablet (325 mg total) by mouth 2 (two) times daily. 04/29/20  Conan Bowens, MD  ibuprofen (ADVIL) 600 MG tablet Take 1 tablet (600 mg total) by mouth every 6 (six) hours as needed for headache, mild pain, moderate pain or cramping. 04/29/20   Conan Bowens, MD  ibuprofen (ADVIL) 800 MG tablet Take 1 tablet (800 mg total) by mouth every 8 (eight) hours as needed. 05/16/20   Diamantina Monks, MD  NIFEdipine (PROCARDIA-XL/NIFEDICAL-XL) 30 MG 24 hr tablet Take 2 tablets (60 mg total) by mouth daily. Can increase to twice a day as needed for symptomatic contractions 04/29/20   Conan Bowens, MD  oxyCODONE (OXY IR/ROXICODONE) 5 MG immediate release tablet Take one tablet every 6 hours as needed for pain not relieved by Tylenol/acetaminophen, and ibuprofen. 05/16/20   Sherrie George, PA-C  Prenatal Vit-Fe Fumarate-FA (MULTIVITAMIN-PRENATAL) 27-0.8 MG TABS tablet Take 1  tablet by mouth daily at 12 noon.    [provider]  simethicone (GAS-X) 80 MG chewable tablet Chew 1 tablet (80 mg total) by mouth 4 (four) times daily as needed for flatulence. 05/16/20 05/16/21  Diamantina Monks, MD  zolpidem (AMBIEN) 5 MG tablet Take 1 tablet (5 mg total) by mouth at bedtime as needed for sleep. 04/29/20   Hermina Staggers, MD    Allergies    Peach [prunus persica], Peanut-containing drug products, and Tomato  Review of Systems   Review of Systems  Constitutional:  Negative for appetite change and fever.  HENT:  Negative for congestion.   Respiratory:  Positive for cough.   Cardiovascular:  Negative for chest pain.  Gastrointestinal:  Positive for abdominal pain, diarrhea, nausea and vomiting.  Genitourinary:  Negative for decreased urine volume, difficulty urinating, flank pain, vaginal bleeding and vaginal discharge.  Musculoskeletal:  Positive for myalgias. Negative for neck pain and neck stiffness.  Skin:  Negative for rash.  Neurological:  Positive for headaches.  Psychiatric/Behavioral:  Negative for confusion.    Physical Exam Updated Vital Signs BP (!) 158/100 (BP Location: Right Arm)   Pulse 73   Temp 98.3 F (36.8 C) (Oral)   Resp 18   Ht 5\' 4"  (1.626 m)   Wt 87.1 kg   LMP 02/23/2021 (Approximate)   SpO2 99%   BMI 32.96 kg/m   Physical Exam Vitals and nursing note reviewed.  Constitutional:      Appearance: She is well-developed. She is not toxic-appearing.  Neck:     Meningeal: Kernig's sign absent.  Cardiovascular:     Rate and Rhythm: Normal rate and regular rhythm.     Pulses: Normal pulses.  Pulmonary:     Effort: Pulmonary effort is normal.  Abdominal:     General: There is no distension.     Palpations: Abdomen is soft.     Tenderness: There is abdominal tenderness. There is no right CVA tenderness or left CVA tenderness.     Comments: Diffuse ttp of the entire lower abdomen. Abd soft, no guarding or rebound tenderness.  No CVA tenderness.    Musculoskeletal:     Cervical back: Full passive range of motion without pain and normal range of motion. No tenderness.     Right lower leg: No edema.     Left lower leg: No edema.  Skin:    General: Skin is warm.     Capillary Refill: Capillary refill takes less than 2 seconds.     Findings: No rash.  Neurological:     General: No focal deficit present.     Mental Status: She  is alert.     Motor: No weakness.    ED Results / Procedures / Treatments   Labs (all labs ordered are listed, but only abnormal results are displayed) Labs Reviewed  COMPREHENSIVE METABOLIC PANEL - Abnormal; Notable for the following components:      Result Value   Calcium 8.2 (*)    Anion gap 4 (*)    All other components within normal limits  CBC WITH DIFFERENTIAL/PLATELET - Abnormal; Notable for the following components:   WBC 2.8 (*)    Neutro Abs 1.3 (*)    All other components within normal limits  URINALYSIS, ROUTINE W REFLEX MICROSCOPIC - Abnormal; Notable for the following components:   APPearance HAZY (*)    Hgb urine dipstick SMALL (*)    Protein, ur 30 (*)    Bacteria, UA RARE (*)    All other components within normal limits  RESP PANEL BY RT-PCR (FLU A&B, COVID) ARPGX2  LIPASE, BLOOD  POC URINE PREG, ED    EKG None  Radiology No results found.  Procedures Procedures   Medications Ordered in ED Medications  ondansetron (ZOFRAN) injection 4 mg (4 mg Intravenous Given 03/16/21 1539)  sodium chloride 0.9 % bolus 1,000 mL (1,000 mLs Intravenous New Bag/Given 03/16/21 1539)  ketorolac (TORADOL) 30 MG/ML injection 30 mg (30 mg Intravenous Given 03/16/21 1539)  diphenhydrAMINE (BENADRYL) injection 25 mg (25 mg Intravenous Given 03/16/21 1539)  metoCLOPramide (REGLAN) injection 10 mg (10 mg Intravenous Given 03/16/21 1540)    ED Course  I have reviewed the triage vital signs and the nursing notes.  Pertinent labs & imaging results that were available during my  care of the patient were reviewed by me and considered in my medical decision making (see chart for details).    MDM Rules/Calculators/A&P                           Patient here with lower abdominal pain, nausea, vomiting, and diarrhea.  Symptoms x3 days.  Symptoms are associated with gradual onset of headache and slight cough. On exam, patient has some mild diffuse lower abdominal tenderness to palpation.  Abdomen is otherwise soft and nontender.  COVID test negative.  Patient will need labs and CT abdomen pelvis.  Requesting medication for headache.  No meningeal signs on exam.  Vital signs reviewed.   On recheck, patient has received headache medications and now requesting to leave.  Her work up today is still pending and I have explained that her source of her abdominal pain is still unknown that her work-up is incomplete.  I also discussed risk involved in leaving prior to completion of her work-up.  Patient states she verbalized understanding of this but continues to want to leave stating that she is uncomfortable and wants to go home.  Pt advised she will need to sign out AGAINST MEDICAL ADVICE.  She is agreeable to return if her symptoms worsen or she changes her mind.    Final Clinical Impression(s) / ED Diagnoses Final diagnoses:  Lower abdominal pain    Rx / DC Orders ED Discharge Orders     None        Pauline Aus, PA-C 03/16/21 2313    Vanetta Mulders, MD 03/18/21 1615

## 2021-03-16 NOTE — ED Notes (Signed)
Pt here with multiple complaints, vomiting/diarrhea/headache, headache being the worst problem x 3 days. Pt swabbed for covid in triage and is negative

## 2021-03-16 NOTE — ED Triage Notes (Signed)
Pt to the ED with complaints of Abdominal pain with N/V/D x 3 days.

## 2021-12-30 IMAGING — CR DG ABDOMEN 2V
2 series · 2 of 2 positions shown · non-contrast
Comparison: None.

CLINICAL DATA: Abdominal pain.  Recent C-section.

EXAM:
ABDOMEN - 2 VIEW

[abdomen erect]
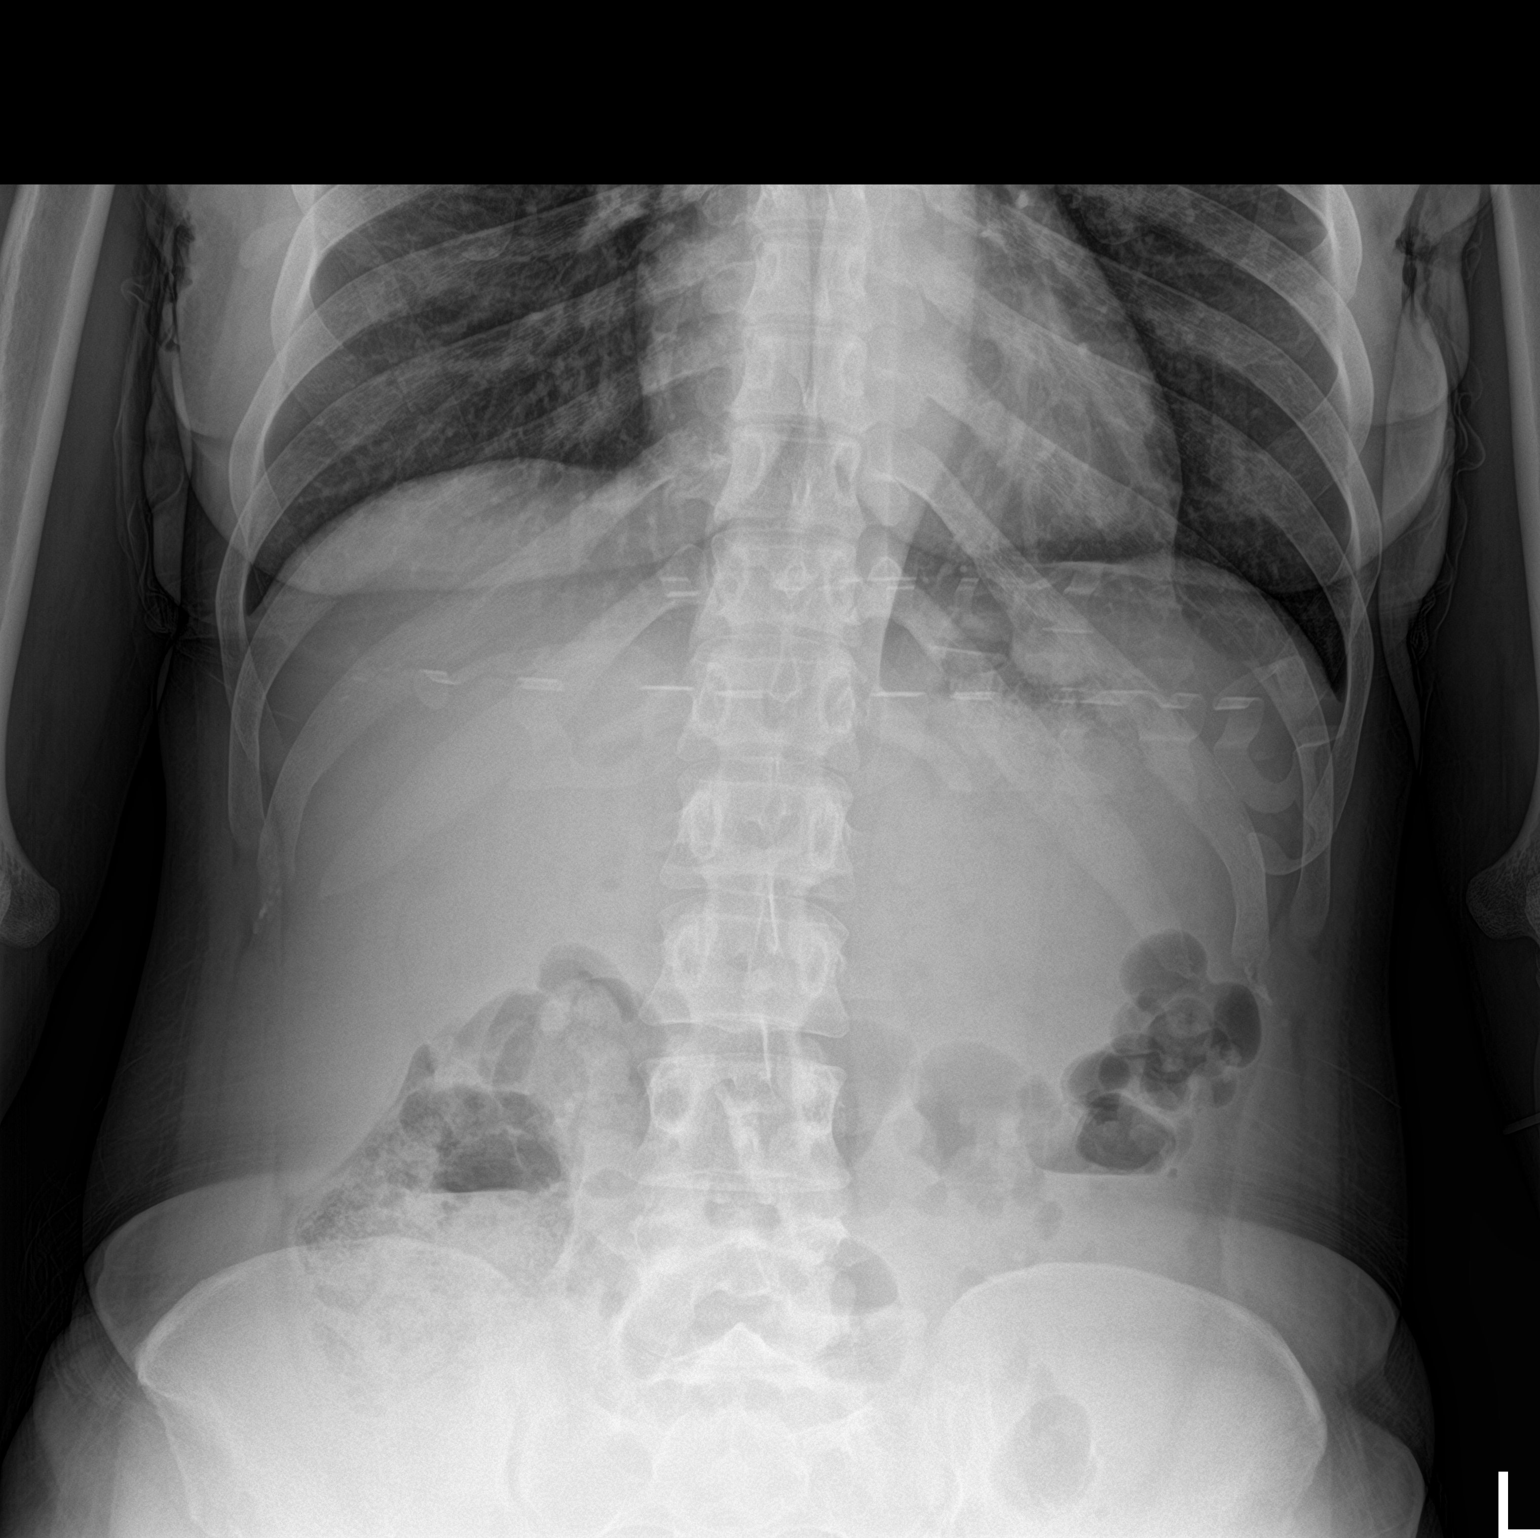

[abdomen supine]
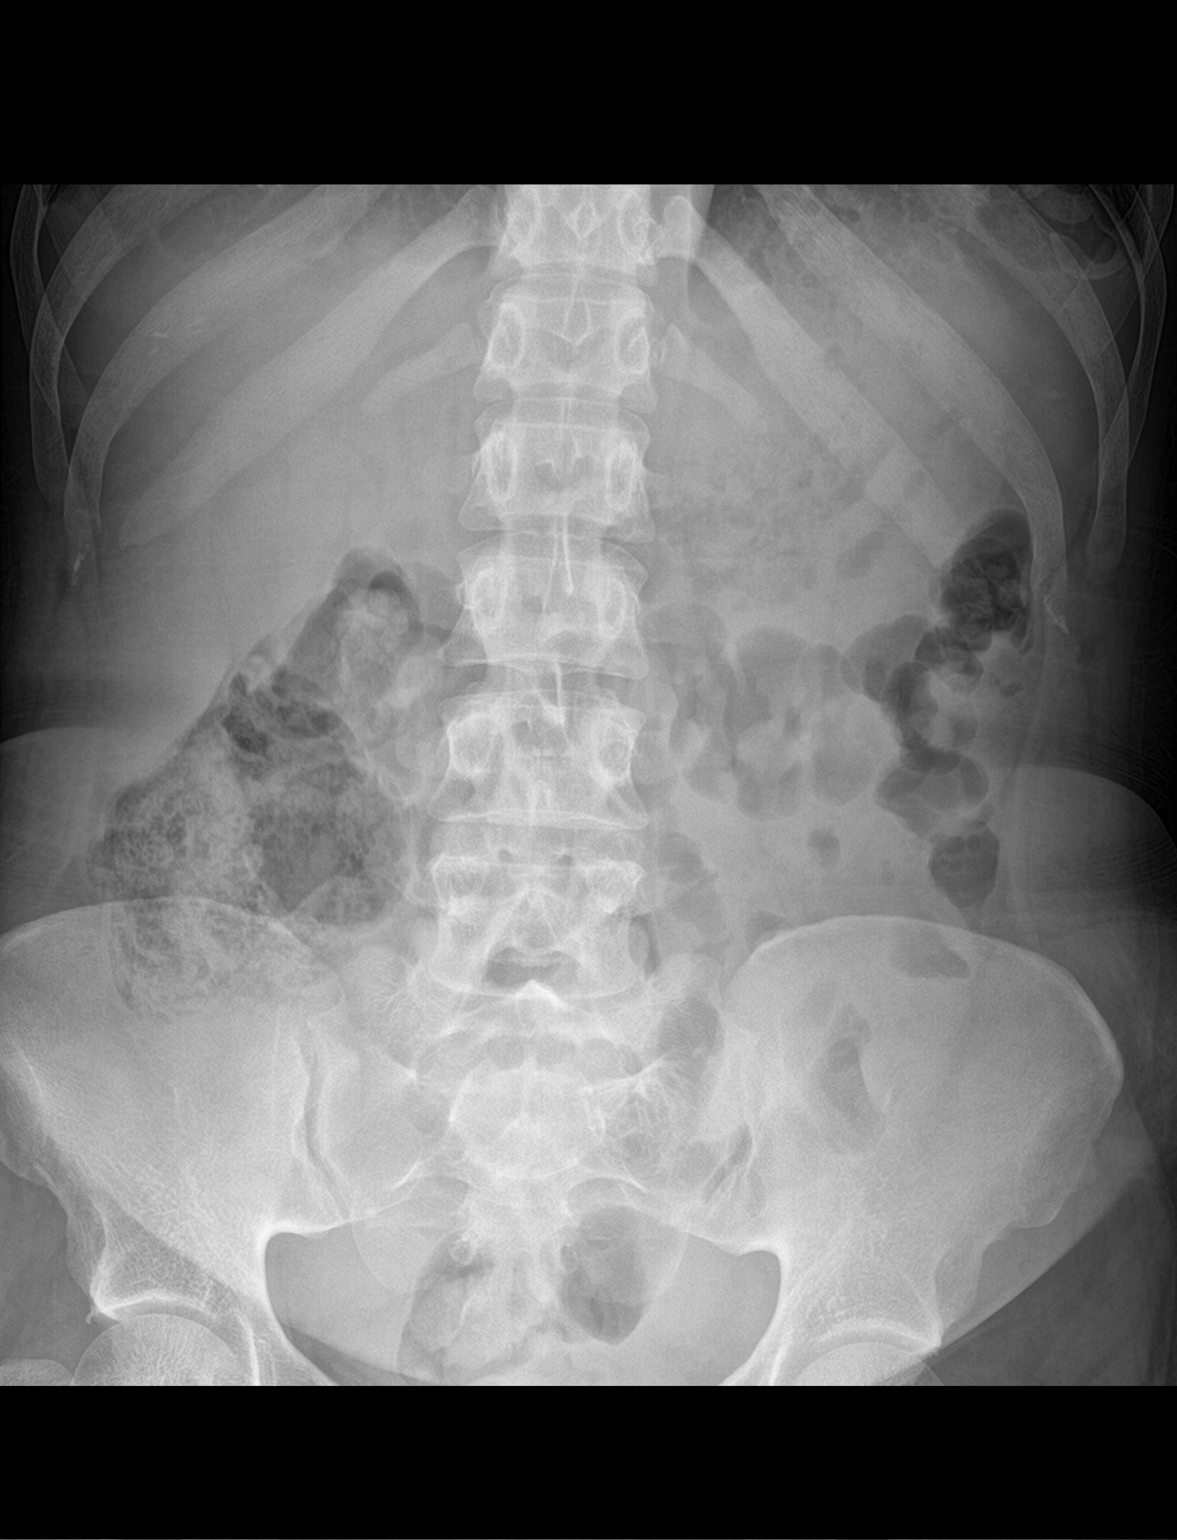

[2 of 2 positions shown; findings below may reference images not displayed]

FINDINGS: The bowel gas pattern is normal. There is no free intraperitoneal
air. There are no suspicious abdominal calcifications. Linear
densities projecting over the upper abdomen are presumably related
to overlying clothing which was not removed. No acute osseous
findings are seen.
IMPRESSION: No acute abdominal findings. Presumed external artifact overlying
the upper abdomen.

## 2024-07-05 ENCOUNTER — Emergency Department (HOSPITAL_COMMUNITY): Payer: Self-pay

## 2024-07-05 ENCOUNTER — Emergency Department (HOSPITAL_COMMUNITY)
Admission: EM | Admit: 2024-07-05 | Discharge: 2024-07-05 | Disposition: A | Discharge status: Home / Self Care | Payer: Self-pay | Attending: Emergency Medicine | Admitting: Emergency Medicine

## 2024-07-05 DIAGNOSIS — J45909 Unspecified asthma, uncomplicated: Secondary | ICD-10-CM | POA: Insufficient documentation

## 2024-07-05 DIAGNOSIS — I10 Essential (primary) hypertension: Secondary | ICD-10-CM | POA: Insufficient documentation

## 2024-07-05 DIAGNOSIS — J069 Acute upper respiratory infection, unspecified: Secondary | ICD-10-CM | POA: Insufficient documentation

## 2024-07-05 DIAGNOSIS — D649 Anemia, unspecified: Secondary | ICD-10-CM | POA: Insufficient documentation

## 2024-07-05 LAB — RESP PANEL BY RT-PCR (RSV, FLU A&B, COVID)  RVPGX2
Influenza A by PCR: NEGATIVE
Influenza B by PCR: NEGATIVE
Resp Syncytial Virus by PCR: NEGATIVE
SARS Coronavirus 2 by RT PCR: NEGATIVE

## 2024-07-05 LAB — CBC WITH DIFFERENTIAL/PLATELET
Abs Immature Granulocytes: 0.01 K/uL (ref 0.00–0.07)
Basophils Absolute: 0 K/uL (ref 0.0–0.1)
Basophils Relative: 0 %
Eosinophils Absolute: 0.1 K/uL (ref 0.0–0.5)
Eosinophils Relative: 2 %
HCT: 27.7 % — ABNORMAL LOW (ref 36.0–46.0)
Hemoglobin: 8.1 g/dL — ABNORMAL LOW (ref 12.0–15.0)
Immature Granulocytes: 0 %
Lymphocytes Relative: 35 %
Lymphs Abs: 1.7 K/uL (ref 0.7–4.0)
MCH: 18.9 pg — ABNORMAL LOW (ref 26.0–34.0)
MCHC: 29.2 g/dL — ABNORMAL LOW (ref 30.0–36.0)
MCV: 64.6 fL — ABNORMAL LOW (ref 80.0–100.0)
Monocytes Absolute: 0.4 K/uL (ref 0.1–1.0)
Monocytes Relative: 8 %
Neutro Abs: 2.6 K/uL (ref 1.7–7.7)
Neutrophils Relative %: 55 %
Platelets: 357 K/uL (ref 150–400)
RBC: 4.29 MIL/uL (ref 3.87–5.11)
RDW: 21.2 % — ABNORMAL HIGH (ref 11.5–15.5)
Smear Review: NORMAL
WBC: 4.7 K/uL (ref 4.0–10.5)
nRBC: 0 % (ref 0.0–0.2)

## 2024-07-05 LAB — BASIC METABOLIC PANEL WITH GFR
Anion gap: 10 (ref 5–15)
BUN: 5 mg/dL — ABNORMAL LOW (ref 6–20)
CO2: 27 mmol/L (ref 22–32)
Calcium: 8.9 mg/dL (ref 8.9–10.3)
Chloride: 104 mmol/L (ref 98–111)
Creatinine, Ser: 0.69 mg/dL (ref 0.44–1.00)
GFR, Estimated: >60 mL/min (ref >60)
Glucose, Bld: 86 mg/dL (ref 70–99)
Potassium: 3.2 mmol/L — ABNORMAL LOW (ref 3.5–5.1)
Sodium: 141 mmol/L (ref 135–145)

## 2024-07-05 LAB — HCG, QUANTITATIVE, PREGNANCY: hCG, Beta Chain, Quant, S: <1 m[IU]/mL (ref <5)

## 2024-07-05 MED ORDER — POTASSIUM CHLORIDE CRYS ER 20 MEQ PO TBCR
40.0000 meq | EXTENDED_RELEASE_TABLET | Freq: Once | ORAL | Status: AC
Start: 2024-07-05 — End: 2024-07-05
  Administered 2024-07-05: 40 meq via ORAL
  Filled 2024-07-05: qty 2

## 2024-07-05 MED ORDER — METOPROLOL SUCCINATE ER 50 MG PO TB24
50.0000 mg | ORAL_TABLET | Freq: Every day | ORAL | Status: DC
  Administered 2024-07-05: 50 mg via ORAL
  Filled 2024-07-05: qty 1

## 2024-07-05 MED ORDER — BENZONATATE 200 MG PO CAPS: 200.0000 mg | ORAL_CAPSULE | Freq: Three times a day (TID) | ORAL | 0 refills | Status: AC | PRN

## 2024-07-05 MED ORDER — METOPROLOL SUCCINATE ER 50 MG PO TB24: 50.0000 mg | ORAL_TABLET | Freq: Every day | ORAL | 0 refills | Status: AC

## 2024-07-05 MED ORDER — PREDNISONE 20 MG PO TABS: 40.0000 mg | ORAL_TABLET | Freq: Every day | ORAL | 0 refills | Status: AC

## 2024-07-05 MED ORDER — ONDANSETRON 4 MG PO TBDP
4.0000 mg | ORAL_TABLET | Freq: Once | ORAL | Status: AC
  Administered 2024-07-05: 4 mg via ORAL
  Filled 2024-07-05: qty 1

## 2024-07-05 MED ORDER — ACETAMINOPHEN 325 MG PO TABS
650.0000 mg | ORAL_TABLET | Freq: Once | ORAL | Status: AC
Start: 2024-07-05 — End: 2024-07-05
  Administered 2024-07-05: 650 mg via ORAL
  Filled 2024-07-05: qty 2

## 2024-07-05 MED ORDER — ONDANSETRON HCL 4 MG PO TABS: 4.0000 mg | ORAL_TABLET | Freq: Four times a day (QID) | ORAL | 0 refills | Status: AC

## 2024-07-05 MED ORDER — ALBUTEROL SULFATE HFA 108 (90 BASE) MCG/ACT IN AERS
1.0000 | INHALATION_SPRAY | Freq: Once | RESPIRATORY_TRACT | Status: AC
  Administered 2024-07-05: 2 via RESPIRATORY_TRACT
  Filled 2024-07-05: qty 6.7

## 2024-07-05 MED ORDER — BENZONATATE 100 MG PO CAPS
200.0000 mg | ORAL_CAPSULE | Freq: Once | ORAL | Status: AC
  Administered 2024-07-05: 200 mg via ORAL
  Filled 2024-07-05: qty 2

## 2024-07-05 MED ORDER — IPRATROPIUM-ALBUTEROL 0.5-2.5 (3) MG/3ML IN SOLN
3.0000 mL | Freq: Once | RESPIRATORY_TRACT | Status: AC
  Administered 2024-07-05: 3 mL via RESPIRATORY_TRACT
  Filled 2024-07-05: qty 3

## 2024-07-05 MED ORDER — FERROUS SULFATE 325 (65 FE) MG PO TABS: 325.0000 mg | ORAL_TABLET | Freq: Every day | ORAL | 0 refills | Status: AC

## 2024-07-05 NOTE — ED Triage Notes (Signed)
 Pt comes in for cold s/s and high BP. BP at 201/128 at home. Pt has not been taking BP meds due to running out. Pt is also having SOB. Pt has productive cough of yellow/ green phlegm. A&Ox4.

## 2024-07-05 NOTE — Discharge Instructions (Signed)
 I have prescribed a blood pressure medication for you to take.  It is important that you take this medication daily.  I recommend that you write your blood pressures down and keep a log that you can take with you when you see your primary care provider.  Also, your blood counts today were low.  This can make you feel very weak and tired.  I have prescribed iron  to take daily.  This medication can constipate you and cause your stools to look dark.  I recommend you take an over-the-counter stool softener while taking this medicine.  You have also been prescribed medication to help with your cough and congestion.  Use the inhaler 1 to 2 puffs every 4-6 hours as needed.  Please follow-up with a primary care office in [place] or you may contact one of the clinics listed.  Return to the emergency department for any new or worsening symptoms.

## 2024-07-05 NOTE — ED Provider Notes (Signed)
 [place] [facility] AT [facility] Provider Note   CSN: [id number] Arrival date & time: 07/05/24  1456     Patient presents with: Cough and Hypertension   Leah Underwood is a 38 y.o. female.    Cough Associated symptoms: wheezing   Associated symptoms: no chills, no fever, no headaches and no sore throat   Hypertension Pertinent negatives include no abdominal pain and no headaches.       Leah Underwood is a 38 y.o. female with past medical history of anemia, hypertension, asthma who presents to the Emergency Department complaining of nasal congestion, cough nausea vomiting.  Symptoms present for over 1 week.  States her cough has been productive of colored sputum.  Cough is occasionally producing emesis as well.  No fever or chills.  States her blood pressure has been elevated for some time has history of same.  She has been out of her blood pressure medicine for 1 to 2 months.  States that she ran out of the medication and never went back to her primary care doctor to have a refill.  She reports having some shortness of breath associated with excessive coughing.  She denies having any chest pain.  Prior to Admission medications   Medication Sig Start Date End Date Taking? Authorizing Provider  acetaminophen  (TYLENOL ) 325 MG tablet You can take 2 tablets every 4 hours as needed for pain. You can alternate with the ibuprofen  from home.  Do not take more than 4000 mg of Tylenol /acetaminophen  per day, it can harm your liver.  You can buy this over the counter at any drug store. 05/16/20   Tonnie George, PA-C  ferrous sulfate  (FERROUSUL) 325 (65 FE) MG tablet Take 1 tablet (325 mg total) by mouth 2 (two) times daily. 04/29/20   Nicholaus Burnard HERO, MD  ibuprofen  (ADVIL ) 600 MG tablet Take 1 tablet (600 mg total) by mouth every 6 (six) hours as needed for headache, mild pain, moderate pain or cramping. 04/29/20   Nicholaus Burnard HERO, MD  ibuprofen  (ADVIL ) 800 MG tablet Take 1  tablet (800 mg total) by mouth every 8 (eight) hours as needed. 05/16/20   Paola Dreama SAILOR, MD  NIFEdipine  (PROCARDIA -XL/NIFEDICAL-XL) 30 MG 24 hr tablet Take 2 tablets (60 mg total) by mouth daily. Can increase to twice a day as needed for symptomatic contractions 04/29/20   Nicholaus Burnard HERO, MD  oxyCODONE  (OXY IR/ROXICODONE ) 5 MG immediate release tablet Take one tablet every 6 hours as needed for pain not relieved by Tylenol /acetaminophen , and ibuprofen . 05/16/20   Tonnie George, PA-C  Prenatal Vit-Fe Fumarate-FA (MULTIVITAMIN-PRENATAL) 27-0.8 MG TABS tablet Take 1 tablet by mouth daily at 12 noon.    [provider]  simethicone  (GAS-X) 80 MG chewable tablet Chew 1 tablet (80 mg total) by mouth 4 (four) times daily as needed for flatulence. 05/16/20 05/16/21  Paola Dreama SAILOR, MD  zolpidem  (AMBIEN ) 5 MG tablet Take 1 tablet (5 mg total) by mouth at bedtime as needed for sleep. 04/29/20   Ervin, Michael L, MD    Allergies: Peach [prunus persica], Peanut-containing drug products, and Tomato    Review of Systems  Constitutional:  Negative for chills and fever.  HENT:  Negative for sore throat.   Respiratory:  Positive for cough, chest tightness and wheezing.   Gastrointestinal:  Positive for nausea and vomiting. Negative for abdominal pain.  Genitourinary:  Negative for dysuria.  Musculoskeletal:  Negative for arthralgias, neck pain and neck stiffness.  Neurological:  Negative for dizziness, weakness and headaches.    Updated Vital Signs BP (!) 176/92 (BP Location: Right Arm)   Pulse 84   Temp 98.7 F (37.1 C) (Oral)   Resp 18   Ht 5' 4 (1.626 m)   Wt 58.7 kg   SpO2 98%   BMI 22.23 kg/m   Physical Exam Vitals and nursing note reviewed.  Constitutional:      Appearance: She is not toxic-appearing.  HENT:     Right Ear: Tympanic membrane and ear canal normal.     Left Ear: Tympanic membrane and ear canal normal.     Mouth/Throat:     Mouth: Mucous membranes are  moist.     Pharynx: Oropharynx is clear.  Cardiovascular:     Rate and Rhythm: Normal rate and regular rhythm.     Pulses: Normal pulses.  Pulmonary:     Effort: Pulmonary effort is normal.     Breath sounds: Wheezing present.     Comments: Lung sounds slightly diminished with few expiratory wheezes.  I do not appreciate any rales.  No increased work of breathing on exam.  She is actively coughing Abdominal:     Palpations: Abdomen is soft.     Tenderness: There is no abdominal tenderness.  Musculoskeletal:     Cervical back: Normal range of motion.     Right lower leg: No edema.     Left lower leg: No edema.  Lymphadenopathy:     Cervical: No cervical adenopathy.  Skin:    General: Skin is warm.     Capillary Refill: Capillary refill takes less than 2 seconds.  Neurological:     General: No focal deficit present.     Mental Status: She is alert.     Sensory: No sensory deficit.     Motor: No weakness.     (all labs ordered are listed, but only abnormal results are displayed) Labs Reviewed  CBC WITH DIFFERENTIAL/PLATELET - Abnormal; Notable for the following components:      Result Value   Hemoglobin 8.1 (*)    HCT 27.7 (*)    MCV 64.6 (*)    MCH 18.9 (*)    MCHC 29.2 (*)    RDW 21.2 (*)    All other components within normal limits  BASIC METABOLIC PANEL WITH GFR - Abnormal; Notable for the following components:   Potassium 3.2 (*)    BUN 5 (*)    All other components within normal limits  RESP PANEL BY RT-PCR (RSV, FLU A&B, COVID)  [id number]  HCG, QUANTITATIVE, PREGNANCY    EKG: EKG Interpretation Date/Time:  Saturday July 05 2024 16:53:40 EST Ventricular Rate:  69 PR Interval:  101 QRS Duration:  84 QT Interval:  413 QTC Calculation: 443 R Axis:   85  Text Interpretation: Sinus rhythm Short PR interval LVH with secondary repolarization abnormality Anterolateral Q wave, probably normal for age Confirmed by Cleotilde Rogue ([id number]) on 07/05/2024 5:25:21  PM  Radiology: [facility] Chest 2 View Result Date: 07/05/2024 CLINICAL DATA:  Cough EXAM: CHEST - 2 VIEW COMPARISON:  None Available. FINDINGS: The heart size and mediastinal contours are within normal limits. Both lungs are clear. The visualized skeletal structures are unremarkable. IMPRESSION: No active cardiopulmonary disease. Electronically Signed   By: Greig Pique M.D.   On: 07/05/2024 16:43     Procedures   Medications Ordered in the ED  ondansetron  (ZOFRAN -ODT) disintegrating tablet 4 mg (has no administration in time range)  acetaminophen  (TYLENOL )  tablet 650 mg (has no administration in time range)  ipratropium-albuterol (DUONEB) 0.5-2.5 (3) MG/3ML nebulizer solution 3 mL (3 mLs Nebulization Given 07/05/24 1637)  benzonatate (TESSALON) capsule 200 mg (200 mg Oral Given 07/05/24 1637)                                    Medical Decision Making   Patient with history of anemia and hypertension not currently compliant with her medications.  Here for URI symptoms and cough.  She has had some shortness of breath and wheezing.  No fever or chills.  No hemoptysis.  On my exam, she is actively coughing and I appreciate some expiratory wheezes but no increased work of breathing.  There is no tachycardia or tachypnea or hypoxia.  She is hypertensive.  I suspect she has been chronically hypertensive as she admits to being noncompliant.  Will address her symptoms.  I suspect viral process.  Will check labs, chest x-ray.  Doubt PE pneumonia consideration as well.  Will address her hypertension as well  Amount and/or Complexity of Data Reviewed Labs: ordered.    Details: No leukocytosis, hemoglobin 8.1 today.  She does have a history of anemia her last hemoglobin that I can review was 2022 and it was normal then.  She has been as low as 7.7 in the past.  Does not currently take iron .  Respiratory panel negative potassium slightly low at 3.2 Radiology: ordered.    Details: Chest x-ray without  evidence of pneumonia or other acute cardiopulmonary process ECG/medicine tests: ordered.    Details: EKG sinus rhythm short PR interval with LVH Discussion of management or test interpretation with external provider(s):   Patient here with history of anemia has a low hemoglobin but I do not have a recent value for comparison.  Here for URI symptoms and hypertension.  She is feeling better after albuterol neb, Tylenol  and Zofran   Her blood pressures have been varying but overall elevated.  She has history of hypertension but she has been noncompliant with her medications.  She ran out of her medicines couple of months ago and never returned to her doctor for refills.  On review of her records she was on labetalol  and Procardia  during her pregnancy.  She was given Toprol-XL here with some improvement of her blood pressure.  She reports feeling better and she is ready for discharge home.  She has had some posttussive emesis here but no persistent vomiting.  She has a benign abdominal exam.  And she denies having any chest pain.  Anemic without symptoms.  She is not currently taking her iron , have counseled patient on risks for not controlling her blood pressure or taking her iron .  She states that she is now ready to take better care of herself and is agreeable to start back on iron  and antihypertensive.  Will also provide symptomatic treatment for her likely viral process.  No tachycardia tachypnea or hypoxia during her ER stay.   Risk OTC drugs. Prescription drug management.        Final diagnoses:  Viral URI with cough  Anemia, unspecified type  Hypertension, unspecified type    ED Discharge Orders     None          Herlinda Milling, PA-C 07/05/24 1934    Cleotilde Rogue, MD 07/06/24 1233

## 2024-07-05 NOTE — ED Notes (Signed)
Provider notified of pt elevated blood pressure
